# Patient Record
Sex: Male | Born: 1975 | Hispanic: Yes | Marital: Married | Smoking: Never smoker
Health system: Southern US, Community
[De-identification: ages and names within clinical notes are randomized; demographics above are authoritative.]

## PROBLEM LIST (undated history)

## (undated) DIAGNOSIS — I10 Essential (primary) hypertension: Secondary | ICD-10-CM

## (undated) DIAGNOSIS — K219 Gastro-esophageal reflux disease without esophagitis: Secondary | ICD-10-CM

## (undated) DIAGNOSIS — R519 Headache, unspecified: Secondary | ICD-10-CM

## (undated) DIAGNOSIS — R001 Bradycardia, unspecified: Secondary | ICD-10-CM

## (undated) DIAGNOSIS — R51 Headache: Secondary | ICD-10-CM

## (undated) DIAGNOSIS — K760 Fatty (change of) liver, not elsewhere classified: Secondary | ICD-10-CM

## (undated) HISTORY — PX: APPENDECTOMY: SHX54

---

## 2016-11-20 DIAGNOSIS — J019 Acute sinusitis, unspecified: Secondary | ICD-10-CM | POA: Diagnosis not present

## 2016-11-29 ENCOUNTER — Emergency Department (HOSPITAL_COMMUNITY): Payer: BLUE CROSS/BLUE SHIELD

## 2016-11-29 ENCOUNTER — Emergency Department (HOSPITAL_COMMUNITY)
Admission: EM | Admit: 2016-11-29 | Discharge: 2016-11-29 | Disposition: A | Discharge status: Home / Self Care | Payer: BLUE CROSS/BLUE SHIELD | Attending: Emergency Medicine | Admitting: Emergency Medicine

## 2016-11-29 ENCOUNTER — Encounter (HOSPITAL_COMMUNITY): Payer: Self-pay | Admitting: Emergency Medicine

## 2016-11-29 DIAGNOSIS — Z7982 Long term (current) use of aspirin: Secondary | ICD-10-CM | POA: Insufficient documentation

## 2016-11-29 DIAGNOSIS — R072 Precordial pain: Secondary | ICD-10-CM | POA: Diagnosis not present

## 2016-11-29 DIAGNOSIS — R202 Paresthesia of skin: Secondary | ICD-10-CM | POA: Insufficient documentation

## 2016-11-29 DIAGNOSIS — R0789 Other chest pain: Secondary | ICD-10-CM | POA: Diagnosis present

## 2016-11-29 DIAGNOSIS — R079 Chest pain, unspecified: Secondary | ICD-10-CM | POA: Diagnosis not present

## 2016-11-29 LAB — CBC
HEMATOCRIT: 46.7 % (ref 39.0–52.0)
HEMOGLOBIN: 15.5 g/dL (ref 13.0–17.0)
MCH: 30.2 pg (ref 26.0–34.0)
MCHC: 33.2 g/dL (ref 30.0–36.0)
MCV: 90.9 fL (ref 78.0–100.0)
Platelets: 234 10*3/uL (ref 150–400)
RBC: 5.14 MIL/uL (ref 4.22–5.81)
RDW: 13.1 % (ref 11.5–15.5)
WBC: 8 10*3/uL (ref 4.0–10.5)

## 2016-11-29 LAB — HEPATIC FUNCTION PANEL
ALT: 35 U/L (ref 17–63)
AST: 22 U/L (ref 15–41)
Albumin: 4.2 g/dL (ref 3.5–5.0)
Alkaline Phosphatase: 52 U/L (ref 38–126)
BILIRUBIN TOTAL: 0.8 mg/dL (ref 0.3–1.2)
Total Protein: 7.7 g/dL (ref 6.5–8.1)

## 2016-11-29 LAB — BASIC METABOLIC PANEL
ANION GAP: 7 (ref 5–15)
BUN: 11 mg/dL (ref 6–20)
CHLORIDE: 104 mmol/L (ref 101–111)
CO2: 28 mmol/L (ref 22–32)
Calcium: 9.5 mg/dL (ref 8.9–10.3)
Creatinine, Ser: 0.9 mg/dL (ref 0.61–1.24)
GFR calc Af Amer: >60 mL/min (ref >60)
GLUCOSE: 100 mg/dL — AB (ref 65–99)
POTASSIUM: 3.7 mmol/L (ref 3.5–5.1)
Sodium: 139 mmol/L (ref 135–145)

## 2016-11-29 LAB — LIPASE, BLOOD: Lipase: 27 U/L (ref 11–51)

## 2016-11-29 LAB — I-STAT TROPONIN, ED: Troponin i, poc: 0 ng/mL (ref 0.00–0.08)

## 2016-11-29 MED ORDER — PANTOPRAZOLE SODIUM 20 MG PO TBEC: 20.0000 mg | DELAYED_RELEASE_TABLET | Freq: Every day | ORAL | 0 refills | Status: DC

## 2016-11-29 MED ORDER — PANTOPRAZOLE SODIUM 20 MG PO TBEC
20.0000 mg | DELAYED_RELEASE_TABLET | Freq: Once | ORAL | Status: AC
  Administered 2016-11-29: 20 mg via ORAL
  Filled 2016-11-29: qty 1

## 2016-11-29 MED ORDER — ASPIRIN 81 MG PO CHEW: 81.0000 mg | CHEWABLE_TABLET | Freq: Every day | ORAL | 0 refills | Status: DC

## 2016-11-29 MED ORDER — ASPIRIN 81 MG PO CHEW
81.0000 mg | CHEWABLE_TABLET | Freq: Once | ORAL | Status: AC
  Administered 2016-11-29: 81 mg via ORAL
  Filled 2016-11-29: qty 1

## 2016-11-29 NOTE — ED Provider Notes (Signed)
[facility]-EMER[facility]ENCY DEPT Provider Note   CSN: [id number] Arrival date & time: 11/29/16  1657     History   Chief Complaint Chief Complaint  Patient presents with  . Chest Pain  . Extremity Weakness  . Abdominal Pain    HPI Wayne Stewart is a 41 y.o. male.  HPI Patient has no past medical history. He reports that about a week ago he got a central chest discomfort that he also perceived into his back. He reports it lasted for several hours and then went away. He described it to be like a deeper bruise or aching kind of feeling. It occurred at rest. He did not have other associated symptoms at that time. He went through most of the weekend without any symptoms. He reports yesterday he had an episode where he felt like his left arm and left leg had a slightly tingly sensation. He denies there was any weakness or dysfunction. There was no limitation to his activities. He does not endorse numbness. At that time he doesn't recall there was any of the central chest and discomfort associated. He also notes he said some discomfort as migrated a bit in his back from his central back to his left back/shoulder area. 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 He has not noted any pain or physical limitation or dyspnea on exertion with activity such as playing with his children or things require more physical exertion.  Social history: Patient is a nonsmoker. Family history: Patient's mother has a pacemaker. Patient's father had a heart attack later in life but is living. Patient has healthy sister. History reviewed. No pertinent past medical history.  There are no active problems to display for this patient.   History reviewed. No pertinent surgical history.     Home Medications    Prior to Admission medications   Medication Sig Start Date End Date Taking? Authorizing Provider  aspirin 81 MG chewable tablet Chew 1 tablet (81 mg total) by mouth  daily. 11/29/16   Arby Barrette, MD  pantoprazole (PROTONIX) 20 MG tablet Take 1 tablet (20 mg total) by mouth daily. 11/29/16   Arby Barrette, MD    Family History No family history on file.  Social History Social History  Substance Use Topics  . Smoking status: Never Smoker  . Smokeless tobacco: Never Used  . Alcohol use Yes     Allergies   Patient has no allergy information on record.   Review of Systems Review of Systems 10 Systems reviewed and are negative for acute change except as noted in the HPI.   Physical Exam Updated Vital Signs BP 138/88   Pulse (!) 53   Temp 97.9 F (36.6 C) (Oral)   Resp 12   Ht 5\' 6"  (1.676 m)   Wt 205 lb (93 kg)   SpO2 100%   BMI 33.09 kg/m   Physical Exam  Constitutional: He is oriented to person, place, and time. He appears well-developed and well-nourished.  HENT:  Head: Normocephalic and atraumatic.  Eyes: Conjunctivae and EOM are normal.  Neck: Neck supple.  Cardiovascular: Normal rate and regular rhythm.   No murmur heard. Pulmonary/Chest: Effort normal and breath sounds normal. No respiratory distress.  Abdominal: Soft. He exhibits no distension. There is no tenderness. There is no guarding.  Musculoskeletal: He exhibits no edema or tenderness.  Neurological: He is alert and oriented to person, place, and time. No cranial nerve deficit or sensory  deficit. He exhibits normal muscle tone. Coordination normal.  Skin: Skin is warm and dry.  Psychiatric: He has a normal mood and affect.  Nursing note and vitals reviewed.    ED Treatments / Results  Labs (all labs ordered are listed, but only abnormal results are displayed) Labs Reviewed  BASIC METABOLIC PANEL - Abnormal; Notable for the following:       Result Value   Glucose, Bld 100 (*)    All other components within normal limits  HEPATIC FUNCTION PANEL - Abnormal; Notable for the following:    Bilirubin, Direct <0.1 (*)    All other components within normal limits   CBC  LIPASE, BLOOD  I-STAT TROPOININ, ED    EKG  EKG Interpretation  Date/Time:  Wednesday November 29 2016 17:01:20 EST Ventricular Rate:  53 PR Interval:  168 QRS Duration: 100 QT Interval:  412 QTC Calculation: 386 R Axis:   27 Text Interpretation:  Sinus bradycardia Otherwise normal ECG no STEMI. no acute ischemic appearance. no old comparison Confirmed by Donnald GarrePfeiffer, MD, Lebron ConnersMarcy [phone]([id number]) on 11/29/2016 6:32:03 PM       Radiology Dg Chest 2 View  Result Date: 11/29/2016 CLINICAL DATA:  Upper chest pain and nausea for 1 week. Initial encounter. EXAM: CHEST  2 VIEW COMPARISON:  None. FINDINGS: The heart size is exaggerated by low lung volumes. Mild bibasilar atelectasis is present. There is no significant edema or effusion. No focal airspace consolidation is present. The visualized soft tissues and bony thorax are unremarkable. IMPRESSION: 1. Low lung volumes. 2. No acute cardiopulmonary disease. Electronically Signed   By: Marin Robertshristopher  Mattern M.D.   On: 11/29/2016 17:25    Procedures Procedures (including critical care time)  Medications Ordered in ED Medications  pantoprazole (PROTONIX) EC tablet 20 mg (20 mg Oral Given 11/29/16 1946)  aspirin chewable tablet 81 mg (81 mg Oral Given 11/29/16 1946)     Initial Impression / Assessment and Plan / ED Course  I have reviewed the triage vital signs and the nursing notes.  Pertinent labs & imaging results that were available during my care of the patient were reviewed by me and considered in my medical decision making (see chart for details).     Final Clinical Impressions(s) / ED Diagnoses   Final diagnoses:  Substernal precordial chest pain  Paresthesia of left upper and lower extremity  Patient had a episode chest pain several days ago and then a recurrence today. These are occurring at rest. There is a somewhat migratory component. Patient  also experienced a limited episode of paresthesia but not specifically associated with  chest pain. This time, is no evidence of acute MI or unstable angina. Patient's heart score is 1-2. The patient does not have diagnosed hypertension although diastolic pressures have been slightly elevated while in the emergency department (80s-90s). He is counseled on the necessity for monitoring this and charge instructions include initial dietary and lifestyle management for hypertension.  New Prescriptions Discharge Medication List as of 11/29/2016  9:43 PM    START taking these medications   Details  aspirin 81 MG chewable tablet Chew 1 tablet (81 mg total) by mouth daily., Starting Wed 11/29/2016, Print    pantoprazole (PROTONIX) 20 MG tablet Take 1 tablet (20 mg total) by mouth daily., Starting Wed 11/29/2016, Print         Arby BarretteMarcy Camala Talwar, MD 11/29/16 2156

## 2016-11-29 NOTE — ED Triage Notes (Signed)
Pt. Stated, I started having some upper chest pain to the left and went to right. Some numbness, tingling in my left arm and leg.  I've also had an upset stomach.

## 2016-11-30 ENCOUNTER — Encounter: Payer: Self-pay | Admitting: Internal Medicine

## 2016-12-01 ENCOUNTER — Encounter: Payer: Self-pay | Admitting: Internal Medicine

## 2016-12-01 ENCOUNTER — Ambulatory Visit (INDEPENDENT_AMBULATORY_CARE_PROVIDER_SITE_OTHER): Payer: BLUE CROSS/BLUE SHIELD | Admitting: Internal Medicine

## 2016-12-01 VITALS — BP 136/86 | HR 56 | Ht 66.0 in | Wt 206.8 lb

## 2016-12-01 DIAGNOSIS — R079 Chest pain, unspecified: Secondary | ICD-10-CM | POA: Diagnosis not present

## 2016-12-01 NOTE — Patient Instructions (Addendum)
Medication Instructions:    Your physician recommends that you continue on your current medications as directed. Please refer to the Current Medication list given to you today.  --- If you need a refill on your cardiac medications before your next appointment, please call your pharmacy. ---  Labwork:  Lipid profile today  Testing/Procedures:  Your physician recommends that you have a calcium score CT.  Follow-Up:  To be determined after testing.  We will call you with the results   Thank you for choosing [facility]!!     Any Other Special Instructions Will Be Listed Below (If Applicable).

## 2016-12-01 NOTE — Progress Notes (Signed)
Cardiology Office Note   Date:  12/01/2016   ID:  Zaryan Yakubov, DOB [birth year], MRN [id number]  PCP:  No primary care provider on file.  Cardiologist:   Dietrich Pates, MD   Patient referred for CP      History of Present Illness: Wayne Stewart is a 41 y.o. male with a history of  chest discomfort  He was seen in ER on 3/7  Pain also in back Tingly sensation in L arm and L leg    Sensations with and without activity   Will feel SOB with talking, not with walking   Since left the hosp still having symptoms    Moved from [place] about 8 months ago      One episode of dizziness  Recently     Past 3 to 4 wks get L arm and leg tingling  Will last  a couple min then goes away  Can be walking and will occur Current Meds  Medication Sig  . aspirin 81 MG chewable tablet Chew 1 tablet (81 mg total) by mouth daily.  . pantoprazole (PROTONIX) 20 MG tablet Take 1 tablet (20 mg total) by mouth daily.     Allergies:   Patient has no known allergies.   History reviewed. No pertinent past medical history. no medical problems   APpendix removed age 56    History reviewed. No pertinent surgical history.   Social History:  The patient  reports that he has never smoked. He has never used smokeless tobacco. He reports that he drinks alcohol. He reports that he does not use drugs.   6 to 8 beers over weekend    Family History:  The patient's family history is not on file.   Dad died of heart attack first event Dad was a Government social research officer   Mother with irreg HB  Has pacemaker   Maternal GF  Strokes    Grandmother    ROS:  Please see the history of present illness. All other systems are reviewed and  Negative to the above problem except as noted.    PHYSICAL EXAM: VS:  BP 136/86   Pulse (!) 56   Ht 5\' 6"  (1.676 m)   Wt 206 lb 12.8 oz (93.8 kg)   BMI 33.38 kg/m   GEN: Well nourished, well developed, in no acute distress  HEENT: normal  Neck: no JVD, carotid bruits, or masses Cardiac: RRR;  no murmurs, rubs, or gallops,no edema  Respiratory:  clear to auscultation bilaterally, normal work of breathing GI: soft, nontender, nondistended, + BS  No hepatomegaly  MS: no deformity Moving all extremities   Skin: warm and dry, no rash Neuro:  Strength and sensation are intact Psych: euthymic mood, full affect   EKG:  EKG is ordered today. SB 53 bpm     Lipid Panel No results found for: CHOL, TRIG, HDL, CHOLHDL, VLDL, LDLCALC, LDLDIRECT    Wt Readings from Last 3 Encounters:  12/01/16 206 lb 12.8 oz (93.8 kg)  11/29/16 205 lb (93 kg)      ASSESSMENT AND PLAN:  1  Chest pain  / Back pain  Atypical  Concerning  Pt with family history though father was a smoker  I reviewed with patient  I would reocmm 1. Calcium score CT and 2. Lipid panel  Continue current regimen  2 L sided tingling  Not sure what this represents. Follow for now.  I am not convinced any central neuro process  Current medicines are reviewed at length with the patient today.  The patient does not have concerns regarding medicines.  Signed, Dietrich PatesPaula Artavis Cowie, MD  12/01/2016 4:06 PM    [facility] [address], [place], [place]  [address] Phone: [phone]) [phone]; Fax: [phone]) [phone]

## 2016-12-02 LAB — LIPID PANEL
CHOLESTEROL TOTAL: 232 mg/dL — AB (ref 100–199)
Chol/HDL Ratio: 5.3 ratio units — ABNORMAL HIGH (ref 0.0–5.0)
HDL: 44 mg/dL (ref >39)
LDL CALC: 137 mg/dL — AB (ref 0–99)
TRIGLYCERIDES: 253 mg/dL — AB (ref 0–149)
VLDL CHOLESTEROL CAL: 51 mg/dL — AB (ref 5–40)

## 2016-12-14 ENCOUNTER — Ambulatory Visit (INDEPENDENT_AMBULATORY_CARE_PROVIDER_SITE_OTHER)
Admission: RE | Admit: 2016-12-14 | Discharge: 2016-12-14 | Disposition: A | Discharge status: Home / Self Care | Payer: Self-pay | Source: Ambulatory Visit | Attending: Internal Medicine | Admitting: Internal Medicine

## 2016-12-14 DIAGNOSIS — R079 Chest pain, unspecified: Secondary | ICD-10-CM

## 2016-12-21 ENCOUNTER — Encounter: Payer: Self-pay | Admitting: *Deleted

## 2017-01-08 DIAGNOSIS — E782 Mixed hyperlipidemia: Secondary | ICD-10-CM | POA: Diagnosis not present

## 2017-01-08 DIAGNOSIS — K219 Gastro-esophageal reflux disease without esophagitis: Secondary | ICD-10-CM | POA: Diagnosis not present

## 2017-01-08 DIAGNOSIS — J309 Allergic rhinitis, unspecified: Secondary | ICD-10-CM | POA: Diagnosis not present

## 2017-01-08 DIAGNOSIS — Z6834 Body mass index (BMI) 34.0-34.9, adult: Secondary | ICD-10-CM | POA: Diagnosis not present

## 2017-08-10 DIAGNOSIS — J069 Acute upper respiratory infection, unspecified: Secondary | ICD-10-CM | POA: Diagnosis not present

## 2017-08-10 DIAGNOSIS — Z6831 Body mass index (BMI) 31.0-31.9, adult: Secondary | ICD-10-CM | POA: Diagnosis not present

## 2017-08-27 DIAGNOSIS — Z6831 Body mass index (BMI) 31.0-31.9, adult: Secondary | ICD-10-CM | POA: Diagnosis not present

## 2017-08-27 DIAGNOSIS — B029 Zoster without complications: Secondary | ICD-10-CM | POA: Diagnosis not present

## 2017-09-14 DIAGNOSIS — B029 Zoster without complications: Secondary | ICD-10-CM | POA: Diagnosis not present

## 2017-09-14 DIAGNOSIS — Z6832 Body mass index (BMI) 32.0-32.9, adult: Secondary | ICD-10-CM | POA: Diagnosis not present

## 2017-09-14 DIAGNOSIS — E782 Mixed hyperlipidemia: Secondary | ICD-10-CM | POA: Diagnosis not present

## 2017-09-14 DIAGNOSIS — R030 Elevated blood-pressure reading, without diagnosis of hypertension: Secondary | ICD-10-CM | POA: Diagnosis not present

## 2017-11-13 DIAGNOSIS — Z6832 Body mass index (BMI) 32.0-32.9, adult: Secondary | ICD-10-CM | POA: Diagnosis not present

## 2017-11-13 DIAGNOSIS — J069 Acute upper respiratory infection, unspecified: Secondary | ICD-10-CM | POA: Diagnosis not present

## 2017-11-14 DIAGNOSIS — J101 Influenza due to other identified influenza virus with other respiratory manifestations: Secondary | ICD-10-CM | POA: Diagnosis not present

## 2017-11-14 DIAGNOSIS — Z6832 Body mass index (BMI) 32.0-32.9, adult: Secondary | ICD-10-CM | POA: Diagnosis not present

## 2017-11-14 DIAGNOSIS — J069 Acute upper respiratory infection, unspecified: Secondary | ICD-10-CM | POA: Diagnosis not present

## 2017-12-12 IMAGING — CT CT HEART SCORING
2 series · 16 of 20 positions shown, 18 images · non-contrast
Comparison: None.

CLINICAL DATA: Risk stratification

EXAM:
Coronary Calcium Score
TECHNIQUE: The patient was scanned on a Siemens Somatom 64 slice scanner. Axial
non-contrast 3 mm slices were carried out through the heart. The
data set was analyzed on a dedicated work station and scored using
the Agatson method.

[Series 3: lung st 70 % · axial · 0.67mm/px · z∈[-314,-230]mm · 8 of 36 slices shown]
[im 4/36  lung]
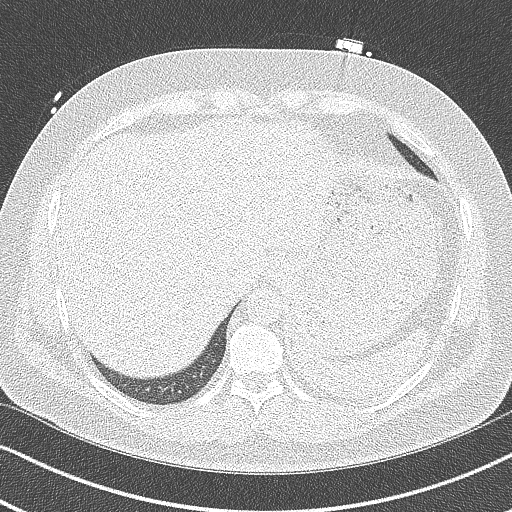
[im 8/36  lung]
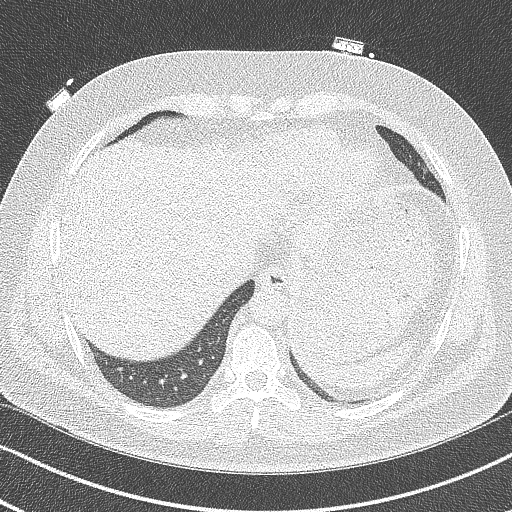
[im 12/36  lung]
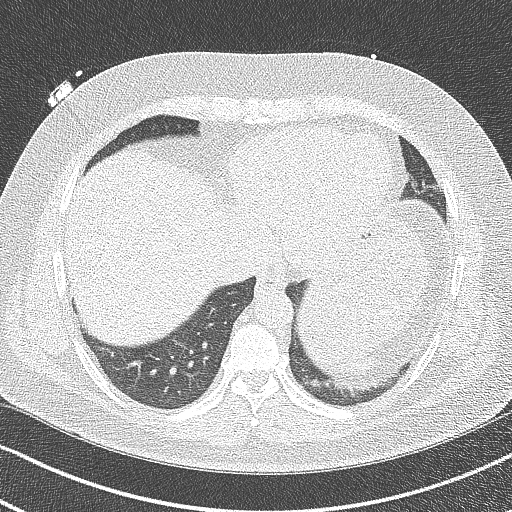
[im 16/36  lung]
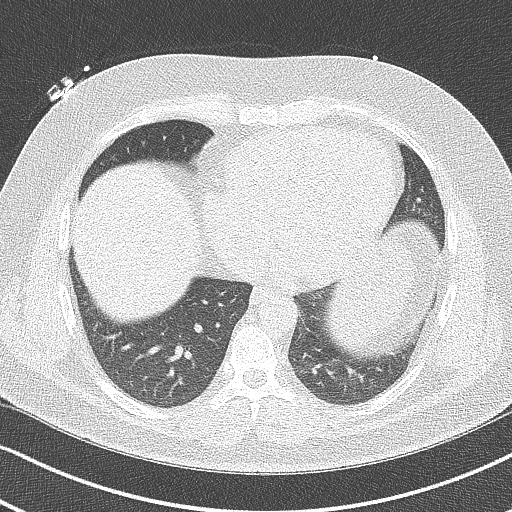
[im 20/36  lung]
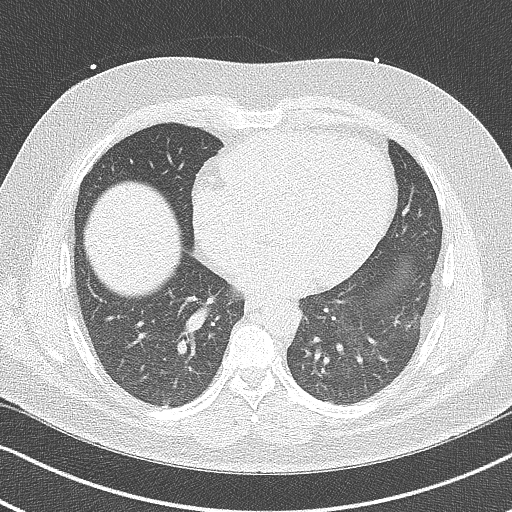
[im 24/36  lung]
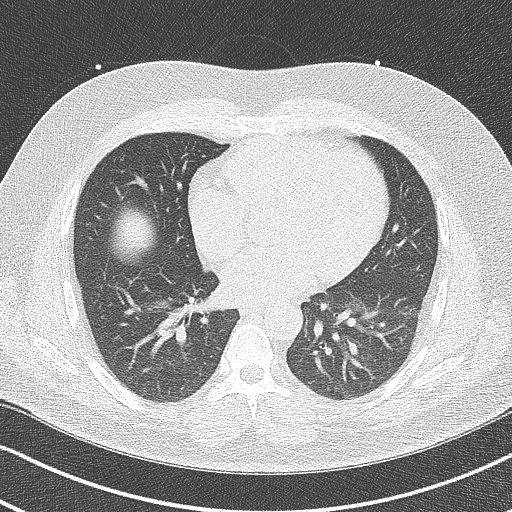
[im 28/36  lung]
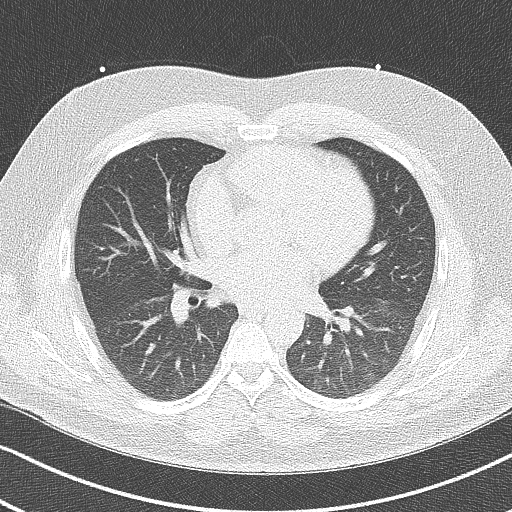
[im 32/36  lung]
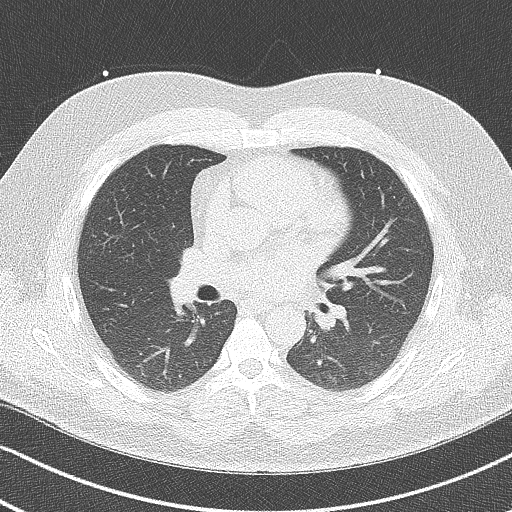

[Series 7: casc 3.0 i36f 2 bestdiast...2 70 % · axial · 0.36mm/px · z∈[-314,-230]mm · 8 of 37 slices shown, 10 images]
[im 5/37  vessel]
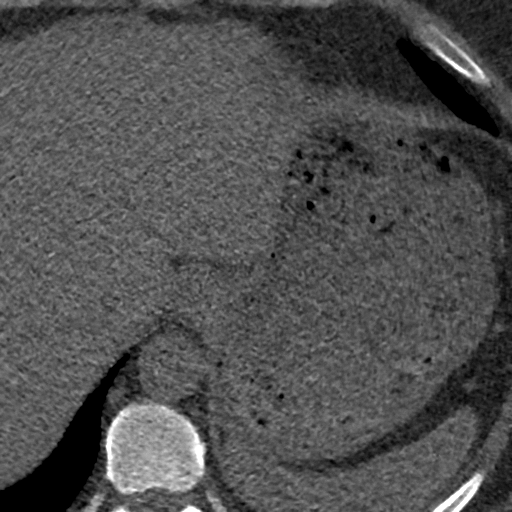
[im 5/37  lung]
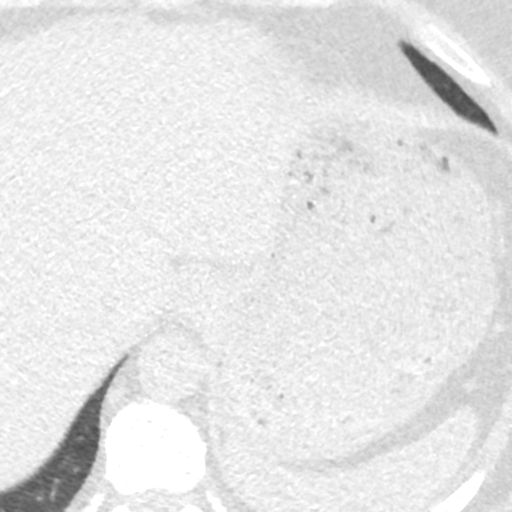
[im 9/37  vessel]
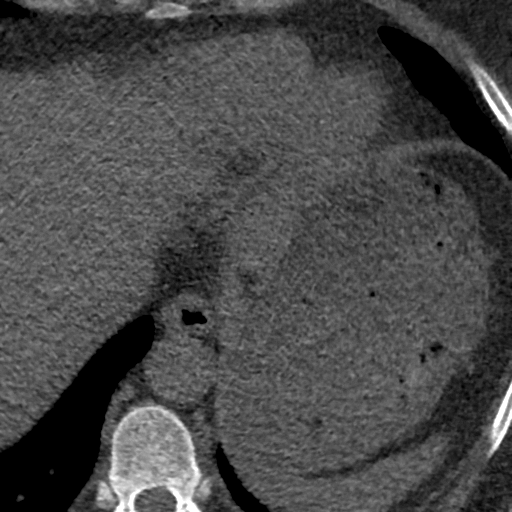
[im 13/37  vessel]
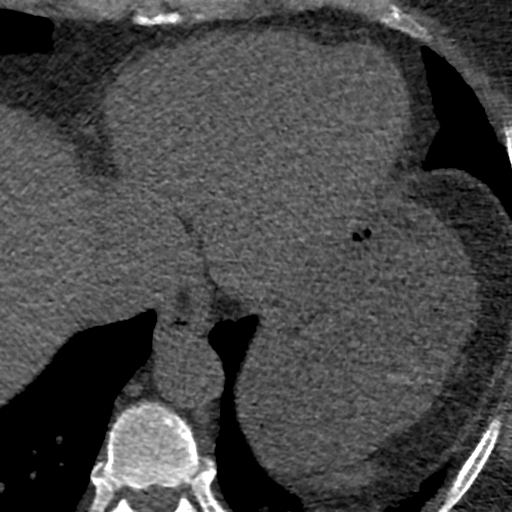
[im 17/37  vessel]
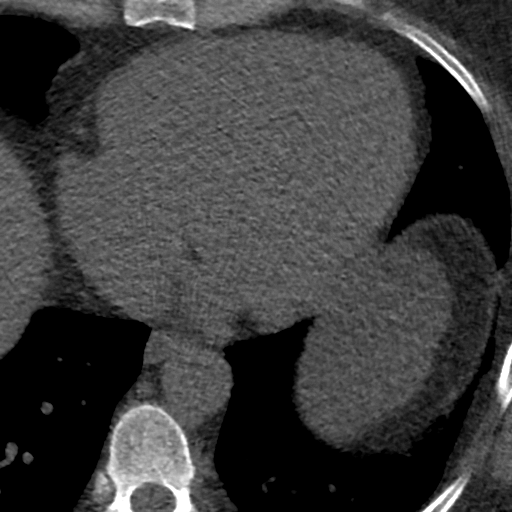
[im 21/37  vessel]
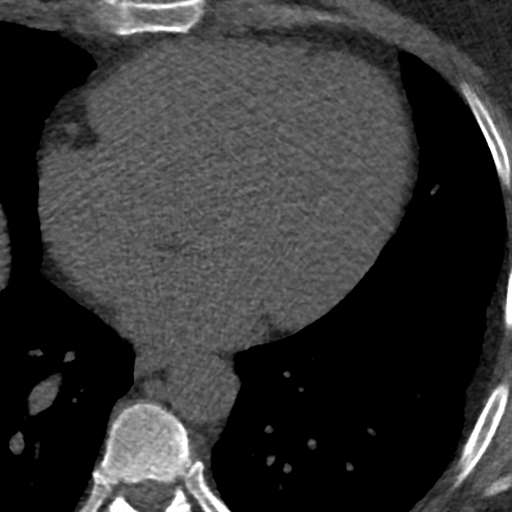
[im 21/37  lung]
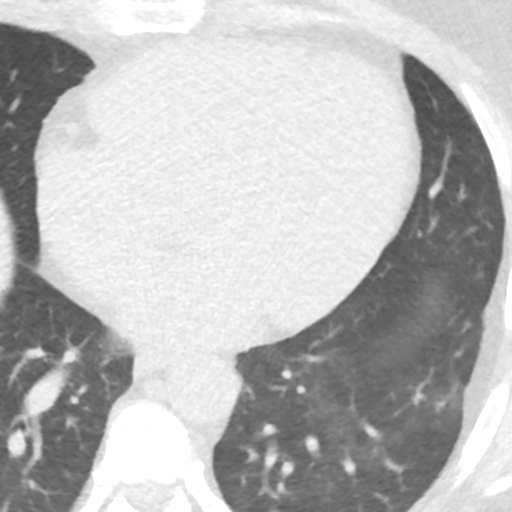
[im 25/37  vessel]
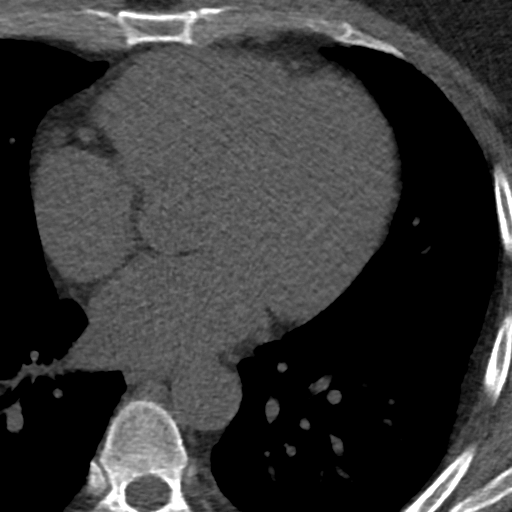
[im 29/37  vessel]
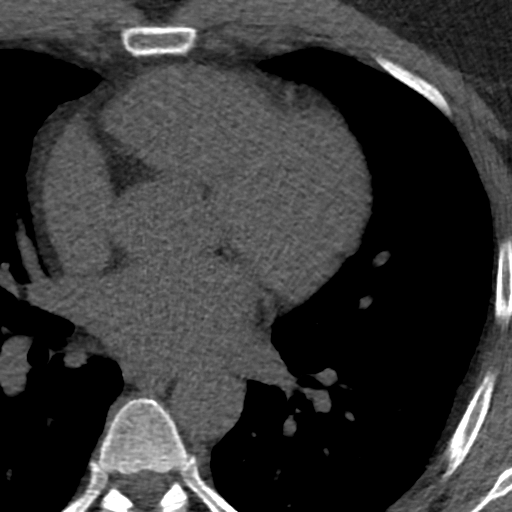
[im 33/37  vessel]
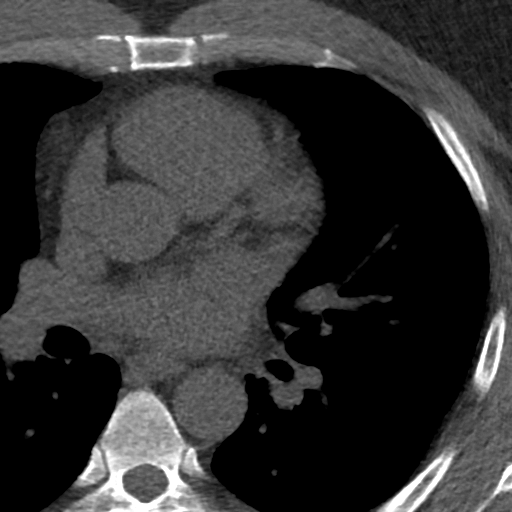

[16 of 20 positions shown; findings below may reference images not displayed]

FINDINGS: Non-cardiac: See separate report from [REDACTED].

Ascending Aorta:  Normal size.  No calcifications.

Pericardium: Normal.

Coronary arteries:  Normal origin.
IMPRESSION: Coronary calcium score of 0. This was 0 percentile for age and sex
matched control.

Asaanah Kusi

EXAM:
OVER-READ INTERPRETATION  CT CHEST

The following report is an over-read performed by radiologist Dr.
over-read does not include interpretation of cardiac or coronary
anatomy or pathology. The coronary calcium scoretation by the
cardiologist is attached.
FINDINGS: Within the visualized portions of the thorax there are no suspicious
appearing pulmonary nodules or masses, there is no acute
consolidative airspace disease, no pleural effusions, no
pneumothorax and no lymphadenopathy. Visualized portions of the
upper abdomen are unremarkable. There are no aggressive appearing
lytic or blastic lesions noted in the visualized portions of the
skeleton.
IMPRESSION: No significant incidental noncardiac findings are noted.

## 2018-01-03 DIAGNOSIS — Z6832 Body mass index (BMI) 32.0-32.9, adult: Secondary | ICD-10-CM | POA: Diagnosis not present

## 2018-01-03 DIAGNOSIS — Z23 Encounter for immunization: Secondary | ICD-10-CM | POA: Diagnosis not present

## 2018-01-03 DIAGNOSIS — J309 Allergic rhinitis, unspecified: Secondary | ICD-10-CM | POA: Diagnosis not present

## 2018-01-03 DIAGNOSIS — E782 Mixed hyperlipidemia: Secondary | ICD-10-CM | POA: Diagnosis not present

## 2018-01-03 DIAGNOSIS — R030 Elevated blood-pressure reading, without diagnosis of hypertension: Secondary | ICD-10-CM | POA: Diagnosis not present

## 2018-02-14 ENCOUNTER — Other Ambulatory Visit: Payer: Self-pay | Admitting: Family Medicine

## 2018-02-14 DIAGNOSIS — Z23 Encounter for immunization: Secondary | ICD-10-CM | POA: Diagnosis not present

## 2018-02-14 DIAGNOSIS — E782 Mixed hyperlipidemia: Secondary | ICD-10-CM | POA: Diagnosis not present

## 2018-02-14 DIAGNOSIS — R1013 Epigastric pain: Secondary | ICD-10-CM

## 2018-02-14 DIAGNOSIS — K219 Gastro-esophageal reflux disease without esophagitis: Secondary | ICD-10-CM | POA: Diagnosis not present

## 2018-02-14 DIAGNOSIS — Z6832 Body mass index (BMI) 32.0-32.9, adult: Secondary | ICD-10-CM | POA: Diagnosis not present

## 2018-02-14 DIAGNOSIS — I10 Essential (primary) hypertension: Secondary | ICD-10-CM | POA: Diagnosis not present

## 2018-02-25 ENCOUNTER — Ambulatory Visit
Admission: RE | Admit: 2018-02-25 | Discharge: 2018-02-25 | Disposition: A | Discharge status: Home / Self Care | Payer: BLUE CROSS/BLUE SHIELD | Source: Ambulatory Visit | Attending: Family Medicine | Admitting: Family Medicine

## 2018-02-25 DIAGNOSIS — K760 Fatty (change of) liver, not elsewhere classified: Secondary | ICD-10-CM | POA: Diagnosis not present

## 2018-02-25 DIAGNOSIS — R1013 Epigastric pain: Secondary | ICD-10-CM

## 2018-02-25 DIAGNOSIS — K8020 Calculus of gallbladder without cholecystitis without obstruction: Secondary | ICD-10-CM | POA: Diagnosis not present

## 2018-03-08 DIAGNOSIS — R5383 Other fatigue: Secondary | ICD-10-CM | POA: Diagnosis not present

## 2018-03-08 DIAGNOSIS — I10 Essential (primary) hypertension: Secondary | ICD-10-CM | POA: Diagnosis not present

## 2018-03-11 DIAGNOSIS — K219 Gastro-esophageal reflux disease without esophagitis: Secondary | ICD-10-CM | POA: Diagnosis not present

## 2018-03-11 DIAGNOSIS — E559 Vitamin D deficiency, unspecified: Secondary | ICD-10-CM | POA: Diagnosis not present

## 2018-03-11 DIAGNOSIS — I10 Essential (primary) hypertension: Secondary | ICD-10-CM | POA: Diagnosis not present

## 2018-03-11 DIAGNOSIS — Z6833 Body mass index (BMI) 33.0-33.9, adult: Secondary | ICD-10-CM | POA: Diagnosis not present

## 2018-03-14 DIAGNOSIS — K8020 Calculus of gallbladder without cholecystitis without obstruction: Secondary | ICD-10-CM | POA: Diagnosis not present

## 2018-03-18 ENCOUNTER — Ambulatory Visit: Payer: Self-pay | Admitting: General Surgery

## 2018-03-18 NOTE — H&P (View-Only) (Signed)
Wayne Stewart Documented: 03/14/2018 4:00 PM Location: [facility] Patient #: [id number] DOB: 07/31/1976 Married / Language: English / Race: Undefined Male   History of Present Illness (Avelynn Sellin M. Marveen Donlon MD; 03/14/2018 4:32 PM) The patient is a 41 year old male who presents for evaluation of gall stones. He is referred by Dr Dewey for evaluation of gallstones and abdominal pain. He states about a month or so ago he developed upper abdominal pain and right upper quadrant pain as well as heartburn and indigestion. He describes it is a fairly constant discomfort but it would increase at times. When he would get it it would last up to a few days but the temporary increase in pain would only last a few hours. It could occur after eating it was often associated with nausea. He had no fever, chills, vomiting. Only prior surgery was appendectomy. No change in bowel movements. He saw his primary care physician and was placed on a PPI as well as an abdominal ultrasound and labs. His labs were unremarkable. His ultrasound showed cholelithiasis without cholecystitis. And fatty infiltration of his liver. He was started on a PPI for study help with some of his issues but he still having right upper quadrant pain rating to this side and upper back.   Problem List/Past Medical (Glyndon Tursi M. Delvina Mizzell, MD; 03/14/2018 4:32 PM) SYMPTOMATIC CHOLELITHIASIS (K80.20)   Past Surgical History (Tanisha A. Brown, RMA; 03/14/2018 4:01 PM) Appendectomy   Diagnostic Studies History (Tanisha A. Brown, RMA; 03/14/2018 4:01 PM) Colonoscopy  never  Allergies (Tanisha A. Brown, RMA; 03/14/2018 4:01 PM) No Known Drug Allergies [03/14/2018]: Allergies Reconciled   Medication History (Tanisha A. Brown, RMA; 03/14/2018 4:02 PM) Atorvastatin Calcium (20MG Tablet, Oral) Active. Losartan Potassium (50MG Tablet, Oral) Active. Omeprazole (40MG Capsule DR, Oral) Active. Aspirin (81MG Tablet, Oral) Active. Medications  Reconciled  Social History (Tanisha A. Brown, RMA; 03/14/2018 4:01 PM) Alcohol use  Moderate alcohol use. Caffeine use  Carbonated beverages, Coffee, Tea. No drug use  Tobacco use  Never smoker.  Family History (Tanisha A. Brown, RMA; 03/14/2018 4:01 PM) Heart Disease  Father.  Other Problems (Ramondo Dietze M. Javares Kaufhold, MD; 03/14/2018 4:32 PM) Back Pain  Chest pain  Cholelithiasis  Gastroesophageal Reflux Disease  High blood pressure  Hypercholesterolemia     Review of Systems (Tanisha A. Brown RMA; 03/14/2018 4:01 PM) General Present- Fatigue. Not Present- Appetite Loss, Chills, Fever, Night Sweats, Weight Gain and Weight Loss. Skin Not Present- Change in Wart/Mole, Dryness, Hives, Jaundice, New Lesions, Non-Healing Wounds, Rash and Ulcer. HEENT Present- Earache, Seasonal Allergies and Sore Throat. Not Present- Hearing Loss, Hoarseness, Nose Bleed, Oral Ulcers, Ringing in the Ears, Sinus Pain, Visual Disturbances, Wears glasses/contact lenses and Yellow Eyes. Respiratory Present- Snoring. Not Present- Bloody sputum, Chronic Cough, Difficulty Breathing and Wheezing. Breast Not Present- Breast Mass, Breast Pain, Nipple Discharge and Skin Changes. Cardiovascular Present- Chest Pain. Not Present- Difficulty Breathing Lying Down, Leg Cramps, Palpitations, Rapid Heart Rate, Shortness of Breath and Swelling of Extremities. Gastrointestinal Present- Abdominal Pain and Indigestion. Not Present- Bloating, Bloody Stool, Change in Bowel Habits, Chronic diarrhea, Constipation, Difficulty Swallowing, Excessive gas, Gets full quickly at meals, Hemorrhoids, Nausea, Rectal Pain and Vomiting. Male Genitourinary Not Present- Blood in Urine, Change in Urinary Stream, Frequency, Impotence, Nocturia, Painful Urination, Urgency and Urine Leakage. Musculoskeletal Present- Back Pain. Not Present- Joint Pain, Joint Stiffness, Muscle Pain, Muscle Weakness and Swelling of Extremities. Neurological Not Present-  Decreased Memory, Fainting, Headaches, Numbness, Seizures, Tingling, Tremor, Trouble walking and Weakness.   Psychiatric Not Present- Anxiety, Bipolar, Change in Sleep Pattern, Depression, Fearful and Frequent crying. Endocrine Not Present- Cold Intolerance, Excessive Hunger, Hair Changes, Heat Intolerance, Hot flashes and New Diabetes. Hematology Not Present- Blood Thinners, Easy Bruising, Excessive bleeding, Gland problems, HIV and Persistent Infections.  Vitals (Tanisha A. Brown RMA; 03/14/2018 4:01 PM) 03/14/2018 4:01 PM Weight: 209.9 lb Height: 66in Body Surface Area: 2.04 m Body Mass Index: 33.88 kg/m  Temp.: 97.8F  Pulse: 88 (Regular)  BP: 124/88 (Sitting, Left Arm, Standard)       Physical Exam (Duilio Heritage M. Alejandra Barna MD; 03/14/2018 4:29 PM) General Mental Status-Alert. General Appearance-Consistent with stated age. Hydration-Well hydrated. Voice-Normal.  Head and Neck Head-normocephalic, atraumatic with no lesions or palpable masses. Trachea-midline. Thyroid Gland Characteristics - normal size and consistency.  Eye Eyeball - Bilateral-Extraocular movements intact. Sclera/Conjunctiva - Bilateral-No scleral icterus.  ENMT Mouth and Throat -Note: lips intact.  Note: normal ext ears   Chest and Lung Exam Chest and lung exam reveals -quiet, even and easy respiratory effort with no use of accessory muscles and on auscultation, normal breath sounds, no adventitious sounds and normal vocal resonance. Inspection Chest Wall - Normal. Back - normal.  Breast - Did not examine.  Cardiovascular Cardiovascular examination reveals -normal heart sounds, regular rate and rhythm with no murmurs and normal pedal pulses bilaterally.  Abdomen Inspection Inspection of the abdomen reveals - No Hernias. Skin - Scar - no surgical scars. Palpation/Percussion Palpation and Percussion of the abdomen reveal - Soft, Non Tender, No Rebound tenderness, No  Rigidity (guarding) and No hepatosplenomegaly. Auscultation Auscultation of the abdomen reveals - Bowel sounds normal. Note: old appy scar   Peripheral Vascular Upper Extremity Palpation - Pulses bilaterally normal.  Neurologic Neurologic evaluation reveals -alert and oriented x 3 with no impairment of recent or remote memory. Mental Status-Normal.  Neuropsychiatric The patient's mood and affect are described as -normal. Judgment and Insight-insight is appropriate concerning matters relevant to self.  Musculoskeletal Normal Exam - Left-Upper Extremity Strength Normal and Lower Extremity Strength Normal. Normal Exam - Right-Upper Extremity Strength Normal and Lower Extremity Strength Normal.  Lymphatic Head & Neck  General Head & Neck Lymphatics: Bilateral - Description - Normal. Axillary - Did not examine. Femoral & Inguinal - Did not examine.    Assessment & Plan (Tc Kapusta M. Kaoru Benda MD; 03/14/2018 4:28 PM) SYMPTOMATIC CHOLELITHIASIS (K80.20) Impression: I believe the patient's symptoms are consistent with gallbladder disease.  We discussed gallbladder disease. The patient was given educational material. We discussed non-operative and operative management. We discussed the signs & symptoms of acute cholecystitis  I discussed laparoscopic cholecystectomy with IOC in detail. The patient was given educational material as well as diagrams detailing the procedure. We discussed the risks and benefits of a laparoscopic cholecystectomy including, but not limited to bleeding, infection, injury to surrounding structures such as the intestine or liver, bile leak, retained gallstones, need to convert to an open procedure, prolonged diarrhea, blood clots such as DVT, common bile duct injury, anesthesia risks, and possible need for additional procedures. We discussed the typical post-operative recovery course. I explained that the likelihood of improvement of their symptoms is  good.  The patient has elected to proceed with surgery. Current Plans Pt Education - Pamphlet Given - Laparoscopic Gallbladder Surgery: discussed with patient and provided information.  Eryc Bodey M. Marchell Froman, MD, FACS General, Bariatric, & Minimally Invasive Surgery [facility]  

## 2018-03-18 NOTE — H&P (Signed)
Wayne Stewart Documented: 03/14/2018 4:00 PM Location: [facility] Patient #: [id number] DOB: 05-24-1976 Married / Language: [place] / Race: Undefined Male   History of Present Illness Wayne Stewart M. Wayne Roan Stewart; 03/14/2018 4:32 PM) The patient is a 42 year old male who presents for evaluation of gall stones. He is referred by Dr Wayne Stewart for evaluation of gallstones and abdominal pain. He states about a month or so ago he developed upper abdominal pain and right upper quadrant pain as well as heartburn and indigestion. He describes it is a fairly constant discomfort but it would increase at times. When he would get it it would last up to a few days but the temporary increase in pain would only last a few hours. It could occur after eating it was often associated with nausea. He had no fever, chills, vomiting. Only prior surgery was appendectomy. No change in bowel movements. He saw his primary care physician and was placed on a PPI as well as an abdominal ultrasound and labs. His labs were unremarkable. His ultrasound showed cholelithiasis without cholecystitis. And fatty infiltration of his liver. He was started on a PPI for study help with some of his issues but he still having right upper quadrant pain rating to this side and upper back.   Problem List/Past Medical Wayne Stewart M. Wayne Stewart; 03/14/2018 4:32 PM) SYMPTOMATIC CHOLELITHIASIS (K80.20)   Past Surgical History (Wayne Stewart, RMA; 03/14/2018 4:01 PM) Appendectomy   Diagnostic Studies History (Wayne Stewart, RMA; 03/14/2018 4:01 PM) Colonoscopy  never  Allergies (Wayne Stewart, RMA; 03/14/2018 4:01 PM) No Known Drug Allergies [03/14/2018]: Allergies Reconciled   Medication History (Wayne Stewart, RMA; 03/14/2018 4:02 PM) Atorvastatin Calcium (20MG  Tablet, Oral) Active. Losartan Potassium (50MG  Tablet, Oral) Active. Omeprazole (40MG  Capsule DR, Oral) Active. Aspirin (81MG  Tablet, Oral) Active. Medications  Reconciled  Social History (Wayne Stewart, RMA; 03/14/2018 4:01 PM) Alcohol use  Moderate alcohol use. Caffeine use  Carbonated beverages, Coffee, Tea. No drug use  Tobacco use  Never smoker.  Family History (Wayne Stewart, RMA; 03/14/2018 4:01 PM) Heart Disease  Father.  Other Problems Wayne Stewart M. Wayne Stewart; 03/14/2018 4:32 PM) Back Pain  Chest pain  Cholelithiasis  Gastroesophageal Reflux Disease  High blood pressure  Hypercholesterolemia     Review of Systems (Wayne Stewart RMA; 03/14/2018 4:01 PM) General Present- Fatigue. Not Present- Appetite Loss, Chills, Fever, Night Sweats, Weight Gain and Weight Loss. Skin Not Present- Change in Wart/Mole, Dryness, Hives, Jaundice, New Lesions, Non-Healing Wounds, Rash and Ulcer. HEENT Present- Earache, Seasonal Allergies and Sore Throat. Not Present- Hearing Loss, Hoarseness, Nose Bleed, Oral Ulcers, Ringing in the Ears, Sinus Pain, Visual Disturbances, Wears glasses/contact lenses and Yellow Eyes. Respiratory Present- Snoring. Not Present- Bloody sputum, Chronic Cough, Difficulty Breathing and Wheezing. Breast Not Present- Breast Mass, Breast Pain, Nipple Discharge and Skin Changes. Cardiovascular Present- Chest Pain. Not Present- Difficulty Breathing Lying Down, Leg Cramps, Palpitations, Rapid Heart Rate, Shortness of Breath and Swelling of Extremities. Gastrointestinal Present- Abdominal Pain and Indigestion. Not Present- Bloating, Bloody Stool, Change in Bowel Habits, Chronic diarrhea, Constipation, Difficulty Swallowing, Excessive gas, Gets full quickly at meals, Hemorrhoids, Nausea, Rectal Pain and Vomiting. Male Genitourinary Not Present- Blood in Urine, Change in Urinary Stream, Frequency, Impotence, Nocturia, Painful Urination, Urgency and Urine Leakage. Musculoskeletal Present- Back Pain. Not Present- Joint Pain, Joint Stiffness, Muscle Pain, Muscle Weakness and Swelling of Extremities. Neurological Not Present-  Decreased Memory, Fainting, Headaches, Numbness, Seizures, Tingling, Tremor, Trouble walking and Weakness.  Psychiatric Not Present- Anxiety, Bipolar, Change in Sleep Pattern, Depression, Fearful and Frequent crying. Endocrine Not Present- Cold Intolerance, Excessive Hunger, Hair Changes, Heat Intolerance, Hot flashes and New Diabetes. Hematology Not Present- Blood Thinners, Easy Bruising, Excessive bleeding, Gland problems, HIV and Persistent Infections.  Vitals (Wayne Stewart RMA; 03/14/2018 4:01 PM) 03/14/2018 4:01 PM Weight: 209.9 lb Height: 66in Body Surface Area: 2.04 m Body Mass Index: 33.88 kg/m  Temp.: 97.8F  Pulse: 88 (Regular)  BP: 124/88 (Sitting, Left Arm, Standard)       Physical Exam Wayne Stewart(Wayne Oswald M. Brennley Curtice Stewart; 03/14/2018 4:29 PM) General Mental Status-Alert. General Appearance-Consistent with stated age. Hydration-Well hydrated. Voice-Normal.  Head and Neck Head-normocephalic, atraumatic with no lesions or palpable masses. Trachea-midline. Thyroid Gland Characteristics - normal size and consistency.  Eye Eyeball - Bilateral-Extraocular movements intact. Sclera/Conjunctiva - Bilateral-No scleral icterus.  ENMT Mouth and Throat -Note: lips intact.  Note: normal ext ears   Chest and Lung Exam Chest and lung exam reveals -quiet, even and easy respiratory effort with no use of accessory muscles and on auscultation, normal breath sounds, no adventitious sounds and normal vocal resonance. Inspection Chest Wall - Normal. Back - normal.  Breast - Did not examine.  Cardiovascular Cardiovascular examination reveals -normal heart sounds, regular rate and rhythm with no murmurs and normal pedal pulses bilaterally.  Abdomen Inspection Inspection of the abdomen reveals - No Hernias. Skin - Scar - no surgical scars. Palpation/Percussion Palpation and Percussion of the abdomen reveal - Soft, Non Tender, No Rebound tenderness, No  Rigidity (guarding) and No hepatosplenomegaly. Auscultation Auscultation of the abdomen reveals - Bowel sounds normal. Note: old appy scar   Peripheral Vascular Upper Extremity Palpation - Pulses bilaterally normal.  Neurologic Neurologic evaluation reveals -alert and oriented x 3 with no impairment of recent or remote memory. Mental Status-Normal.  Neuropsychiatric The patient's mood and affect are described as -normal. Judgment and Insight-insight is appropriate concerning matters relevant to self.  Musculoskeletal Normal Exam - Left-Upper Extremity Strength Normal and Lower Extremity Strength Normal. Normal Exam - Right-Upper Extremity Strength Normal and Lower Extremity Strength Normal.  Lymphatic Head & Neck  General Head & Neck Lymphatics: Bilateral - Description - Normal. Axillary - Did not examine. Femoral & Inguinal - Did not examine.    Assessment & Plan Wayne Stewart(Sokha Craker M. Josiah Nieto Stewart; 03/14/2018 4:28 PM) SYMPTOMATIC CHOLELITHIASIS (K80.20) Impression: I believe the patient's symptoms are consistent with gallbladder disease.  We discussed gallbladder disease. The patient was given Agricultural engineereducational material. We discussed non-operative and operative management. We discussed the signs & symptoms of acute cholecystitis  I discussed laparoscopic cholecystectomy with IOC in detail. The patient was given educational material as well as diagrams detailing the procedure. We discussed the risks and benefits of a laparoscopic cholecystectomy including, but not limited to bleeding, infection, injury to surrounding structures such as the intestine or liver, bile leak, retained gallstones, need to convert to an open procedure, prolonged diarrhea, blood clots such as DVT, common bile duct injury, anesthesia risks, and possible need for additional procedures. We discussed the typical post-operative recovery course. I explained that the likelihood of improvement of their symptoms is  good.  The patient has elected to proceed with surgery. Current Plans Pt Education - Pamphlet Given - Laparoscopic Gallbladder Surgery: discussed with patient and provided information.  Mary SellaEric M. Wayne CampanileWilson, Stewart, FACS General, Bariatric, & Minimally Invasive Surgery [facility] [place]

## 2018-03-29 NOTE — Pre-Procedure Instructions (Signed)
Wayne Stewart  03/29/2018    Your procedure is scheduled on Tuesday, April 09, 2018 at 7:30 AM.   Report to [facility] Entrance "A" Admitting Office at 5:30 AM.   Call this number if you have problems the morning of surgery: [phone]   Questions prior to day of surgery, please call [phone] between 8 & 4 PM.   Remember:  Do not eat or drink after midnight Monday, 04/08/18.  Take these medicines the morning of surgery with A SIP OF WATER: Omeprazole (Prilosec), Cetirizine (Zyrtec) - if needed, Fluticasone nasal spray - if needed  Stop Fish Oil and NSAIDS (Ibuprofen, Aleve, etc) 7 days prior to surgery.    Do not wear jewelry.  Do not wear lotions, powders, cologne or deodorant.  Men may shave face and neck.  Do not bring valuables to the hospital.  [facility] is not responsible for any belongings or valuables.  Contacts, dentures or bridgework may not be worn into surgery.  Leave your suitcase in the car.  After surgery it may be brought to your room.  For patients admitted to the hospital, discharge time will be determined by your treatment team.  Patients discharged the day of surgery will not be allowed to drive home.   [place] - Preparing for Surgery  Before surgery, you can play an important role.  Because skin is not sterile, your skin needs to be as free of germs as possible.  You can reduce the number of germs on you skin by washing with CHG (chlorahexidine gluconate) soap before surgery.  CHG is an antiseptic cleaner which kills germs and bonds with the skin to continue killing germs even after washing.  Oral Hygiene is also important in reducing the risk of infection.  Remember to brush your teeth with your regular toothpaste the morning of surgery.  Please DO NOT use if you have an allergy to CHG or antibacterial soaps.  If your skin becomes reddened/irritated stop using the CHG and inform your nurse when you arrive at Short Stay.  Do not shave  (including legs and underarms) for at least 48 hours prior to the first CHG shower.  You may shave your face.  Please follow these instructions carefully:   1.  Shower with CHG Soap the night before surgery and the morning of Surgery.  2.  If you choose to wash your hair, wash your hair first as usual with your normal shampoo.  3.  After you shampoo, rinse your hair and body thoroughly to remove the shampoo. 4.  Use CHG as you would any other liquid soap.  You can apply chg directly to the skin and wash gently with a      scrungie or washcloth.           5.  Apply the CHG Soap to your body ONLY FROM THE NECK DOWN.   Do not use on open wounds or open sores. Avoid contact with your eyes, ears, mouth and genitals (private parts).  Wash genitals (private parts) with your normal soap.  6.  Wash thoroughly, paying special attention to the area where your surgery will be performed.  7.  Thoroughly rinse your body with warm water from the neck down.  8.  DO NOT shower/wash with your normal soap after using and rinsing off the CHG Soap.  9.  Pat yourself dry with a clean towel.            10.  Wear clean  pajamas.            11.  Place clean sheets on your bed the night of your first shower and do not sleep with pets.  Day of Surgery  Shower as above.  Do not apply any lotions/deodorants the morning of surgery.   Please wear clean clothes to the hospital. Remember to brush your teeth with toothpaste.   Please read over the fact sheets that you were given.

## 2018-04-01 ENCOUNTER — Other Ambulatory Visit: Payer: Self-pay

## 2018-04-01 ENCOUNTER — Encounter (HOSPITAL_COMMUNITY)
Admission: RE | Admit: 2018-04-01 | Discharge: 2018-04-01 | Disposition: A | Discharge status: Home / Self Care | Payer: BLUE CROSS/BLUE SHIELD | Source: Ambulatory Visit | Attending: General Surgery | Admitting: General Surgery

## 2018-04-01 ENCOUNTER — Encounter (HOSPITAL_COMMUNITY): Payer: Self-pay

## 2018-04-01 DIAGNOSIS — Z01818 Encounter for other preprocedural examination: Secondary | ICD-10-CM | POA: Insufficient documentation

## 2018-04-01 HISTORY — DX: Gastro-esophageal reflux disease without esophagitis: K21.9

## 2018-04-01 HISTORY — DX: Headache, unspecified: R51.9

## 2018-04-01 HISTORY — DX: Essential (primary) hypertension: I10

## 2018-04-01 HISTORY — DX: Headache: R51

## 2018-04-01 HISTORY — DX: Bradycardia, unspecified: R00.1

## 2018-04-01 HISTORY — DX: Fatty (change of) liver, not elsewhere classified: K76.0

## 2018-04-01 LAB — COMPREHENSIVE METABOLIC PANEL
ALK PHOS: 51 U/L (ref 38–126)
ALT: 34 U/L (ref 0–44)
ANION GAP: 8 (ref 5–15)
AST: 27 U/L (ref 15–41)
Albumin: 3.8 g/dL (ref 3.5–5.0)
BILIRUBIN TOTAL: 0.6 mg/dL (ref 0.3–1.2)
BUN: 10 mg/dL (ref 6–20)
CALCIUM: 9.2 mg/dL (ref 8.9–10.3)
CO2: 25 mmol/L (ref 22–32)
CREATININE: 0.92 mg/dL (ref 0.61–1.24)
Chloride: 106 mmol/L (ref 98–111)
GFR calc Af Amer: >60 mL/min (ref >60)
Glucose, Bld: 102 mg/dL — ABNORMAL HIGH (ref 70–99)
Potassium: 4.1 mmol/L (ref 3.5–5.1)
SODIUM: 139 mmol/L (ref 135–145)
TOTAL PROTEIN: 7.3 g/dL (ref 6.5–8.1)

## 2018-04-01 LAB — CBC
HEMATOCRIT: 45.3 % (ref 39.0–52.0)
HEMOGLOBIN: 14.7 g/dL (ref 13.0–17.0)
MCH: 29.4 pg (ref 26.0–34.0)
MCHC: 32.5 g/dL (ref 30.0–36.0)
MCV: 90.6 fL (ref 78.0–100.0)
Platelets: 218 10*3/uL (ref 150–400)
RBC: 5 MIL/uL (ref 4.22–5.81)
RDW: 12.2 % (ref 11.5–15.5)
WBC: 7 10*3/uL (ref 4.0–10.5)

## 2018-04-01 NOTE — Pre-Procedure Instructions (Signed)
Wayne Stewart  04/01/2018    Your procedure is scheduled on Tuesday, April 09, 2018 at 7:30 AM.   [facility]" Admitting Office at 5:30 AM.   Call this number if you have problems the morning of surgery: [phone]   Questions prior to day of surgery, please call [phone] between 8 & 4 PM.   Remember:  Do not eat after midnight Monday, 04/08/18. May have only clear liquids up until 430 am. (water or clear juice apple or cranberry) Drink presurgery ensure by 430 am day of surgery.   Take these medicines the morning of surgery with A SIP OF WATER: Omeprazole (Prilosec), Cetirizine (Zyrtec) - if needed, Fluticasone nasal spray - if needed  Stop Fish Oil and NSAIDS (Ibuprofen, Aleve, etc) 7 days prior to surgery.    Do not wear jewelry.  Do not wear lotions, powders, cologne or deodorant.  Men may shave face and neck.  Do not bring valuables to the hospital.  [facility] is not responsible for any belongings or valuables.  Contacts, dentures or bridgework may not be worn into surgery.  Leave your suitcase in the car.  After surgery it may be brought to your room.  For patients admitted to the hospital, discharge time will be determined by your treatment team.  Patients discharged the day of surgery will not be allowed to drive home.   [place] - Preparing for Surgery  Before surgery, you can play an important role.  Because skin is not sterile, your skin needs to be as free of germs as possible.  You can reduce the number of germs on you skin by washing with CHG (chlorahexidine gluconate) soap before surgery.  CHG is an antiseptic cleaner which kills germs and bonds with the skin to continue killing germs even after washing.  Oral Hygiene is also important in reducing the risk of infection.  Remember to brush your teeth with your regular toothpaste the morning of surgery.  Please DO NOT use if you have an allergy to CHG or antibacterial soaps.  If  your skin becomes reddened/irritated stop using the CHG and inform your nurse when you arrive at Short Stay.  Do not shave (including legs and underarms) for at least 48 hours prior to the first CHG shower.  You may shave your face.  Please follow these instructions carefully:   1.  Shower with CHG Soap the night before surgery and the morning of Surgery.  2.  If you choose to wash your hair, wash your hair first as usual with your normal shampoo.  3.  After you shampoo, rinse your hair and body thoroughly to remove the shampoo. 4.  Use CHG as you would any other liquid soap.  You can apply chg directly to the skin and wash gently with a      scrungie or washcloth.           5.  Apply the CHG Soap to your body ONLY FROM THE NECK DOWN.   Do not use on open wounds or open sores. Avoid contact with your eyes, ears, mouth and genitals (private parts).  Wash genitals (private parts) with your normal soap.  6.  Wash thoroughly, paying special attention to the area where your surgery will be performed.  7.  Thoroughly rinse your body with warm water from the neck down.  8.  DO NOT shower/wash with your normal soap after using and rinsing off the CHG Soap.  9.  Pat yourself dry with a clean towel.            10.  Wear clean pajamas.            11.  Place clean sheets on your bed the night of your first shower and do not sleep with pets.  Day of Surgery  Shower as above.  Do not apply any lotions/deodorants the morning of surgery.   Please wear clean clothes to the hospital. Remember to brush your teeth with toothpaste.   Please read over the fact sheets that you were given.

## 2018-04-08 MED ORDER — SODIUM CHLORIDE 0.9 % IV SOLN
2.0000 g | INTRAVENOUS | Status: AC
  Administered 2018-04-09: 2 g via INTRAVENOUS
  Filled 2018-04-08: qty 2

## 2018-04-09 ENCOUNTER — Ambulatory Visit (HOSPITAL_COMMUNITY): Payer: BLUE CROSS/BLUE SHIELD | Admitting: Certified Registered"

## 2018-04-09 ENCOUNTER — Encounter (HOSPITAL_COMMUNITY)
Admission: RE | Disposition: A | Discharge status: Home / Self Care | Payer: Self-pay | Source: Ambulatory Visit | Attending: General Surgery

## 2018-04-09 ENCOUNTER — Ambulatory Visit (HOSPITAL_COMMUNITY)
Admission: RE | Admit: 2018-04-09 | Discharge: 2018-04-09 | Disposition: A | Discharge status: Home / Self Care | Payer: BLUE CROSS/BLUE SHIELD | Source: Ambulatory Visit | Attending: General Surgery | Admitting: General Surgery

## 2018-04-09 DIAGNOSIS — Z79899 Other long term (current) drug therapy: Secondary | ICD-10-CM | POA: Diagnosis not present

## 2018-04-09 DIAGNOSIS — K219 Gastro-esophageal reflux disease without esophagitis: Secondary | ICD-10-CM | POA: Diagnosis not present

## 2018-04-09 DIAGNOSIS — Z7982 Long term (current) use of aspirin: Secondary | ICD-10-CM | POA: Insufficient documentation

## 2018-04-09 DIAGNOSIS — E7800 Pure hypercholesterolemia, unspecified: Secondary | ICD-10-CM | POA: Diagnosis not present

## 2018-04-09 DIAGNOSIS — K8010 Calculus of gallbladder with chronic cholecystitis without obstruction: Secondary | ICD-10-CM | POA: Insufficient documentation

## 2018-04-09 DIAGNOSIS — K8020 Calculus of gallbladder without cholecystitis without obstruction: Secondary | ICD-10-CM | POA: Diagnosis not present

## 2018-04-09 DIAGNOSIS — I10 Essential (primary) hypertension: Secondary | ICD-10-CM | POA: Diagnosis not present

## 2018-04-09 HISTORY — PX: CHOLECYSTECTOMY: SHX55

## 2018-04-09 SURGERY — LAPAROSCOPIC CHOLECYSTECTOMY
Anesthesia: General | Site: Abdomen

## 2018-04-09 MED ORDER — BUPIVACAINE-EPINEPHRINE (PF) 0.25% -1:200000 IJ SOLN
INTRAMUSCULAR | Status: AC
  Filled 2018-04-09: qty 30

## 2018-04-09 MED ORDER — OXYCODONE HCL 5 MG PO TABS: 5.0000 mg | ORAL_TABLET | Freq: Four times a day (QID) | ORAL | 0 refills | Status: AC | PRN

## 2018-04-09 MED ORDER — ONDANSETRON HCL 4 MG/2ML IJ SOLN
INTRAMUSCULAR | Status: DC | PRN
  Administered 2018-04-09: 4 mg via INTRAVENOUS

## 2018-04-09 MED ORDER — SUGAMMADEX SODIUM 200 MG/2ML IV SOLN
INTRAVENOUS | Status: DC | PRN
  Administered 2018-04-09: 190 mg via INTRAVENOUS

## 2018-04-09 MED ORDER — GABAPENTIN 300 MG PO CAPS
300.0000 mg | ORAL_CAPSULE | ORAL | Status: AC
  Administered 2018-04-09: 300 mg via ORAL
  Filled 2018-04-09: qty 1

## 2018-04-09 MED ORDER — OXYCODONE HCL 5 MG PO TABS
ORAL_TABLET | ORAL | Status: AC
  Filled 2018-04-09: qty 1

## 2018-04-09 MED ORDER — BUPIVACAINE-EPINEPHRINE 0.25% -1:200000 IJ SOLN
INTRAMUSCULAR | Status: DC | PRN
  Administered 2018-04-09: 30 mL

## 2018-04-09 MED ORDER — 0.9 % SODIUM CHLORIDE (POUR BTL) OPTIME
TOPICAL | Status: DC | PRN
  Administered 2018-04-09: 1000 mL

## 2018-04-09 MED ORDER — SODIUM CHLORIDE 0.9 % IR SOLN
Status: DC | PRN
  Administered 2018-04-09: 1000 mL

## 2018-04-09 MED ORDER — LIDOCAINE HCL (CARDIAC) PF 100 MG/5ML IV SOSY
PREFILLED_SYRINGE | INTRAVENOUS | Status: DC | PRN
  Administered 2018-04-09: 60 mg via INTRAVENOUS

## 2018-04-09 MED ORDER — PROPOFOL 10 MG/ML IV BOLUS
INTRAVENOUS | Status: AC
  Filled 2018-04-09: qty 20

## 2018-04-09 MED ORDER — KETOROLAC TROMETHAMINE 30 MG/ML IJ SOLN: 30.0000 mg | Freq: Once | INTRAMUSCULAR | Status: DC | PRN

## 2018-04-09 MED ORDER — ROCURONIUM BROMIDE 100 MG/10ML IV SOLN
INTRAVENOUS | Status: DC | PRN
  Administered 2018-04-09: 60 mg via INTRAVENOUS

## 2018-04-09 MED ORDER — OXYCODONE HCL 5 MG/5ML PO SOLN: 5.0000 mg | Freq: Once | ORAL | Status: AC | PRN

## 2018-04-09 MED ORDER — HYDROMORPHONE HCL 1 MG/ML IJ SOLN: 0.2500 mg | INTRAMUSCULAR | Status: DC | PRN

## 2018-04-09 MED ORDER — PROMETHAZINE HCL 25 MG/ML IJ SOLN: 6.2500 mg | INTRAMUSCULAR | Status: DC | PRN

## 2018-04-09 MED ORDER — PHENYLEPHRINE HCL 10 MG/ML IJ SOLN
INTRAMUSCULAR | Status: DC | PRN
  Administered 2018-04-09: 80 ug via INTRAVENOUS
  Administered 2018-04-09: 160 ug via INTRAVENOUS
  Administered 2018-04-09: 120 ug via INTRAVENOUS
  Administered 2018-04-09: 40 ug via INTRAVENOUS

## 2018-04-09 MED ORDER — PHENYLEPHRINE HCL 10 MG/ML IJ SOLN
INTRAMUSCULAR | Status: DC | PRN
  Administered 2018-04-09: 50 ug/min via INTRAVENOUS

## 2018-04-09 MED ORDER — CHLORHEXIDINE GLUCONATE 4 % EX LIQD: 60.0000 mL | Freq: Once | CUTANEOUS | Status: DC

## 2018-04-09 MED ORDER — PHENYLEPHRINE 40 MCG/ML (10ML) SYRINGE FOR IV PUSH (FOR BLOOD PRESSURE SUPPORT)
PREFILLED_SYRINGE | INTRAVENOUS | Status: AC
  Filled 2018-04-09: qty 10

## 2018-04-09 MED ORDER — PROPOFOL 10 MG/ML IV BOLUS
INTRAVENOUS | Status: DC | PRN
  Administered 2018-04-09: 150 mg via INTRAVENOUS

## 2018-04-09 MED ORDER — OXYCODONE HCL 5 MG PO TABS: 5.0000 mg | ORAL_TABLET | Freq: Once | ORAL | Status: DC

## 2018-04-09 MED ORDER — MIDAZOLAM HCL 2 MG/2ML IJ SOLN
INTRAMUSCULAR | Status: AC
  Filled 2018-04-09: qty 2

## 2018-04-09 MED ORDER — GLYCOPYRROLATE 0.2 MG/ML IJ SOLN
INTRAMUSCULAR | Status: DC | PRN
  Administered 2018-04-09: 0.2 mg via INTRAVENOUS

## 2018-04-09 MED ORDER — OXYCODONE HCL 5 MG PO TABS
5.0000 mg | ORAL_TABLET | Freq: Once | ORAL | Status: AC | PRN
  Administered 2018-04-09: 5 mg via ORAL

## 2018-04-09 MED ORDER — DEXAMETHASONE SODIUM PHOSPHATE 10 MG/ML IJ SOLN
INTRAMUSCULAR | Status: DC | PRN
  Administered 2018-04-09: 10 mg via INTRAVENOUS

## 2018-04-09 MED ORDER — ACETAMINOPHEN 500 MG PO TABS
1000.0000 mg | ORAL_TABLET | ORAL | Status: AC
  Administered 2018-04-09: 1000 mg via ORAL
  Filled 2018-04-09: qty 2

## 2018-04-09 MED ORDER — STERILE WATER FOR IRRIGATION IR SOLN
Status: DC | PRN
  Administered 2018-04-09: 1000 mL

## 2018-04-09 MED ORDER — KETOROLAC TROMETHAMINE 30 MG/ML IJ SOLN
INTRAMUSCULAR | Status: DC | PRN
  Administered 2018-04-09: 30 mg via INTRAVENOUS

## 2018-04-09 MED ORDER — FENTANYL CITRATE (PF) 250 MCG/5ML IJ SOLN
INTRAMUSCULAR | Status: AC
  Filled 2018-04-09: qty 5

## 2018-04-09 MED ORDER — LACTATED RINGERS IV SOLN
INTRAVENOUS | Status: DC | PRN
  Administered 2018-04-09 (×2): via INTRAVENOUS

## 2018-04-09 MED ORDER — MIDAZOLAM HCL 5 MG/5ML IJ SOLN
INTRAMUSCULAR | Status: DC | PRN
  Administered 2018-04-09: 2 mg via INTRAVENOUS

## 2018-04-09 MED ORDER — FENTANYL CITRATE (PF) 100 MCG/2ML IJ SOLN
INTRAMUSCULAR | Status: DC | PRN
  Administered 2018-04-09 (×2): 50 ug via INTRAVENOUS

## 2018-04-09 SURGICAL SUPPLY — 42 items
APPLIER CLIP 5 13 M/L LIGAMAX5 (MISCELLANEOUS) ×3
BANDAGE ADH SHEER 1  50/CT (GAUZE/BANDAGES/DRESSINGS) ×9 IMPLANT
BENZOIN TINCTURE PRP APPL 2/3 (GAUZE/BANDAGES/DRESSINGS) ×3 IMPLANT
BLADE CLIPPER SURG (BLADE) ×3 IMPLANT
CANISTER SUCT 3000ML PPV (MISCELLANEOUS) ×3 IMPLANT
CHLORAPREP W/TINT 26ML (MISCELLANEOUS) ×3 IMPLANT
CLIP APPLIE 5 13 M/L LIGAMAX5 (MISCELLANEOUS) ×1 IMPLANT
CLOSURE WOUND 1/2 X4 (GAUZE/BANDAGES/DRESSINGS) ×1
COVER SURGICAL LIGHT HANDLE (MISCELLANEOUS) ×3 IMPLANT
DRAPE LAPAROSCOPIC ABDOMINAL (DRAPES) ×3 IMPLANT
DRSG TEGADERM 4X4.75 (GAUZE/BANDAGES/DRESSINGS) ×3 IMPLANT
ELECT REM PT RETURN 9FT ADLT (ELECTROSURGICAL) ×3
ELECTRODE REM PT RTRN 9FT ADLT (ELECTROSURGICAL) ×1 IMPLANT
GAUZE SPONGE 2X2 8PLY STRL LF (GAUZE/BANDAGES/DRESSINGS) ×1 IMPLANT
GLOVE BIOGEL M STRL SZ7.5 (GLOVE) ×3 IMPLANT
GLOVE BIOGEL PI IND STRL 8 (GLOVE) ×2 IMPLANT
GLOVE BIOGEL PI INDICATOR 8 (GLOVE) ×4
GOWN STRL REUS W/ TWL LRG LVL3 (GOWN DISPOSABLE) ×2 IMPLANT
GOWN STRL REUS W/TWL 2XL LVL3 (GOWN DISPOSABLE) ×3 IMPLANT
GOWN STRL REUS W/TWL LRG LVL3 (GOWN DISPOSABLE) ×4
GRASPER SUT TROCAR 14GX15 (MISCELLANEOUS) ×3 IMPLANT
KIT BASIN OR (CUSTOM PROCEDURE TRAY) ×3 IMPLANT
KIT TURNOVER KIT B (KITS) ×3 IMPLANT
NS IRRIG 1000ML POUR BTL (IV SOLUTION) ×3 IMPLANT
PAD ARMBOARD 7.5X6 YLW CONV (MISCELLANEOUS) ×3 IMPLANT
POUCH RETRIEVAL ECOSAC 10 (ENDOMECHANICALS) ×1 IMPLANT
POUCH RETRIEVAL ECOSAC 10MM (ENDOMECHANICALS) ×2
SCISSORS LAP 5X35 DISP (ENDOMECHANICALS) ×3 IMPLANT
SET IRRIG TUBING LAPAROSCOPIC (IRRIGATION / IRRIGATOR) ×3 IMPLANT
SLEEVE ENDOPATH XCEL 5M (ENDOMECHANICALS) ×6 IMPLANT
SPECIMEN JAR SMALL (MISCELLANEOUS) ×3 IMPLANT
SPONGE GAUZE 2X2 STER 10/PKG (GAUZE/BANDAGES/DRESSINGS) ×2
STRIP CLOSURE SKIN 1/2X4 (GAUZE/BANDAGES/DRESSINGS) ×2 IMPLANT
SUT MNCRL AB 4-0 PS2 18 (SUTURE) ×3 IMPLANT
SUT VICRYL 0 UR6 27IN ABS (SUTURE) ×3 IMPLANT
TOWEL OR 17X24 6PK STRL BLUE (TOWEL DISPOSABLE) ×3 IMPLANT
TOWEL OR 17X26 10 PK STRL BLUE (TOWEL DISPOSABLE) ×3 IMPLANT
TRAY LAPAROSCOPIC MC (CUSTOM PROCEDURE TRAY) ×3 IMPLANT
TROCAR XCEL BLUNT TIP 100MML (ENDOMECHANICALS) ×3 IMPLANT
TROCAR XCEL NON-BLD 5MMX100MML (ENDOMECHANICALS) ×3 IMPLANT
TUBING INSUFFLATION (TUBING) ×3 IMPLANT
WATER STERILE IRR 1000ML POUR (IV SOLUTION) ×3 IMPLANT

## 2018-04-09 NOTE — Anesthesia Preprocedure Evaluation (Signed)
Anesthesia Evaluation  Patient identified by MRN, date of birth, ID band Patient awake    Reviewed: Allergy & Precautions, NPO status , Patient's Chart, lab work & pertinent test results  Airway Mallampati: II  TM Distance: >3 FB Neck ROM: Full    Dental no notable dental hx.    Pulmonary neg pulmonary ROS,    Pulmonary exam normal breath sounds clear to auscultation       Cardiovascular hypertension, Normal cardiovascular exam Rhythm:Regular Rate:Normal     Neuro/Psych negative neurological ROS  negative psych ROS   GI/Hepatic Neg liver ROS, GERD  ,  Endo/Other  negative endocrine ROS  Renal/GU negative Renal ROS  negative genitourinary   Musculoskeletal negative musculoskeletal ROS (+)   Abdominal   Peds negative pediatric ROS (+)  Hematology negative hematology ROS (+)   Anesthesia Other Findings   Reproductive/Obstetrics negative OB ROS                             Anesthesia Physical Anesthesia Plan  ASA: II  Anesthesia Plan: General   Post-op Pain Management:    Induction: Intravenous  PONV Risk Score and Plan: 2 and Ondansetron, Dexamethasone and Treatment may vary due to age or medical condition  Airway Management Planned: Oral ETT  Additional Equipment:   Intra-op Plan:   Post-operative Plan: Extubation in OR  Informed Consent: I have reviewed the patients History and Physical, chart, labs and discussed the procedure including the risks, benefits and alternatives for the proposed anesthesia with the patient or authorized representative who has indicated his/her understanding and acceptance.   Dental advisory given  Plan Discussed with: CRNA and Surgeon  Anesthesia Plan Comments:         Anesthesia Quick Evaluation  

## 2018-04-09 NOTE — Progress Notes (Signed)
l hand piv unable to be salvaged/ kinked x 2  Under tegaderm/ removed  / given po meds for pain

## 2018-04-09 NOTE — Anesthesia Procedure Notes (Signed)
Procedure Name: Intubation Date/Time: 04/09/2018 7:40 AM Performed by: Rosiland OzMeyers, Claudean Leavelle, CRNA Pre-anesthesia Checklist: Patient identified, Emergency Drugs available, Suction available, Patient being monitored and Timeout performed Patient Re-evaluated:Patient Re-evaluated prior to induction Oxygen Delivery Method: Circle system utilized Preoxygenation: Pre-oxygenation with 100% oxygen Induction Type: IV induction Ventilation: Mask ventilation without difficulty Laryngoscope Size: Miller and 3 Grade View: Grade I Tube type: Oral Tube size: 7.0 mm Number of attempts: 1 Airway Equipment and Method: Stylet Placement Confirmation: ETT inserted through vocal cords under direct vision,  positive ETCO2 and breath sounds checked- equal and bilateral Secured at: 21 cm Tube secured with: Tape Dental Injury: Teeth and Oropharynx as per pre-operative assessment

## 2018-04-09 NOTE — Interval H&P Note (Signed)
History and Physical Interval Note:  04/09/2018 7:10 AM  Wayne Stewart  has presented today for surgery, with the diagnosis of Symptomatic cholelithiasis  The various methods of treatment have been discussed with the patient and family. After consideration of risks, benefits and other options for treatment, the patient has consented to  Procedure(s): LAPAROSCOPIC CHOLECYSTECTOMY ERAS PATHWAY (N/A) as a surgical intervention .  The patient's history has been reviewed, patient examined, no change in status, stable for surgery.  I have reviewed the patient's chart and labs.  Questions were answered to the patient's satisfaction.     Gaynelle AduEric Jona Erkkila

## 2018-04-09 NOTE — Discharge Instructions (Signed)
[facility] °LAPAROSCOPIC SURGERY: POST OP INSTRUCTIONS °Always review your discharge instruction sheet given to you by the facility where your surgery was performed. °IF YOU HAVE DISABILITY OR FAMILY LEAVE FORMS, YOU MUST BRING THEM TO THE OFFICE FOR PROCESSING.   °DO NOT GIVE THEM TO YOUR DOCTOR. ° °PAIN CONTROL ° °1. First take acetaminophen (Tylenol) AND/or ibuprofen (Advil) to control your pain after surgery.  Follow directions on package.  Taking acetaminophen (Tylenol) and/or ibuprofen (Advil) regularly after surgery will help to control your pain and lower the amount of prescription pain medication you may need.  You should not take more than 4,000 mg (4 grams) of acetaminophen (Tylenol) in 24 hours.  You should not take ibuprofen (Advil), aleve, motrin, naprosyn or other NSAIDS if you have a history of stomach ulcers or chronic kidney disease.  °2. A prescription for pain medication may be given to you upon discharge.  Take your pain medication as prescribed, if you still have uncontrolled pain after taking acetaminophen (Tylenol) or ibuprofen (Advil). °3. Use ice packs to help control pain. °4. If you need a refill on your pain medication, please contact your pharmacy.  They will contact our office to request authorization. Prescriptions will not be filled after 5pm or on week-ends. ° °HOME MEDICATIONS °5. Take your usually prescribed medications unless otherwise directed. ° °DIET °6. You should follow a light diet the first few days after arrival home.  Be sure to include lots of fluids daily. Avoid fatty, fried foods.  ° °CONSTIPATION °7. It is common to experience some constipation after surgery and if you are taking pain medication.  Increasing fluid intake and taking a stool softener (such as Colace) will usually help or prevent this problem from occurring.  A mild laxative (Milk of Magnesia or Miralax) should be taken according to package instructions if there are no bowel  movements after 48 hours. ° °WOUND/INCISION CARE °8. Most patients will experience some swelling and bruising in the area of the incisions.  Ice packs will help.  Swelling and bruising can take several days to resolve.  °9. Unless discharge instructions indicate otherwise, follow guidelines below  °a. STERI-STRIPS - you may remove your outer bandages 48 hours after surgery, and you may shower at that time.  You have steri-strips (small skin tapes) in place directly over the incision.  These strips should be left on the skin for 7-10 days.   °b. DERMABOND/SKIN GLUE - you may shower in 24 hours.  The glue will flake off over the next 2-3 weeks. °10. Any sutures or staples will be removed at the office during your follow-up visit. ° °ACTIVITIES °11. You may resume regular (light) daily activities beginning the next day--such as daily self-care, walking, climbing stairs--gradually increasing activities as tolerated.  You may have sexual intercourse when it is comfortable.  Refrain from any heavy lifting or straining until approved by your doctor. °a. You may drive when you are no longer taking prescription pain medication, you can comfortably wear a seatbelt, and you can safely maneuver your car and apply brakes. ° °FOLLOW-UP °12. You should see your doctor in the office for a follow-up appointment approximately 2-3 weeks after your surgery.  You should have been given your post-op/follow-up appointment when your surgery was scheduled.  If you did not receive a post-op/follow-up appointment, make sure that you call for this appointment within a day or two after you arrive home to insure a convenient appointment time. ° °OTHER   INSTRUCTIONS °13.  ° °WHEN TO CALL YOUR DOCTOR: °1. Fever over 101.0 °2. Inability to urinate °3. Continued bleeding from incision. °4. Increased pain, redness, or drainage from the incision. °5. Increasing abdominal pain ° °The clinic staff is available to answer your questions during regular  business hours.  Please don’t hesitate to call and ask to speak to one of the nurses for clinical concerns.  If you have a medical emergency, go to the nearest emergency room or call 911.  A surgeon from [facility] is always on call at the hospital. °[address], [address], [place], [place]  [address] ? [address] [address], [place], [place]   [address] °[phone] ? [phone] ? [phone] [phone] °Web site: [email or web address] ° °

## 2018-04-09 NOTE — Op Note (Signed)
Wayne Stewart [id number] 28-Dec-1975 04/09/2018  Laparoscopic Cholecystectomy Procedure Note  Indications: This patient presents with symptomatic gallbladder disease and will undergo laparoscopic cholecystectomy.  Pre-operative Diagnosis: symptomatic cholelithiasis  Post-operative Diagnosis: Same  Surgeon: Gaynelle AduEric Iviona Hole MD FACS  Assistants: none  Anesthesia: General endotracheal anesthesia  Procedure Details  The patient was seen again in the Holding Room. The risks, benefits, complications, treatment options, and expected outcomes were discussed with the patient. The possibilities of reaction to medication, pulmonary aspiration, perforation of viscus, bleeding, recurrent infection, finding a normal gallbladder, the need for additional procedures, failure to diagnose a condition, the possible need to convert to an open procedure, and creating a complication requiring transfusion or operation were discussed with the patient. The likelihood of improving the patient's symptoms with return to their baseline status is good.  The patient and/or family concurred with the proposed plan, giving informed consent. The site of surgery properly noted. The patient was taken to Operating Room, identified as Wayne Stewart and the procedure verified as Laparoscopic Cholecystectomy. A Time Out was held and the above information confirmed. Antibiotic prophylaxis was administered. He had received preop enhanced recovery meds (tylenol and gabapentin)  Prior to the induction of general anesthesia, antibiotic prophylaxis was administered. General endotracheal anesthesia was then administered and tolerated well. After the induction, the abdomen was prepped with Chloraprep and draped in the sterile fashion. The patient was positioned in the supine position.  Local anesthetic agent was injected into the skin near the umbilicus and an incision made. We dissected down to the abdominal fascia with blunt dissection.  The fascia  was incised vertically and we entered the peritoneal cavity bluntly.  A pursestring suture of 0-Vicryl was placed around the fascial opening.  The Hasson cannula was inserted and secured with the stay suture.  Pneumoperitoneum was then created with CO2 and tolerated well without any adverse changes in the patient's vital signs. An 5-mm port was placed in the subxiphoid position.  Two 5-mm ports were placed in the right upper quadrant. All skin incisions were infiltrated with a local anesthetic agent before making the incision and placing the trocars.   We positioned the patient in reverse Trendelenburg, tilted slightly to the patient's left.  The gallbladder was identified, the fundus grasped and retracted cephalad. There were some omental adhesions to the edge of right lobe which had to be taken down in order to retract the gallbladder. The gallbladder was thin walled. Adhesions were lysed bluntly and with the electrocautery where indicated, taking care not to injure any adjacent organs or viscus. The infundibulum was grasped and retracted laterally, exposing the peritoneum overlying the triangle of Calot. This was then divided and exposed in a blunt fashion. A critical view of the cystic duct and cystic artery was obtained.  The cystic duct was clearly identified and bluntly dissected circumferentially. The cystic duct was ligated with a clip distally.     The cystic duct was then ligated with clips and divided. The cystic artery which had been identified & dissected free was ligated with clips and divided as well. There was a tiny posterior branch going to the gallbladder which was clipped.   The gallbladder was dissected from the liver bed in retrograde fashion with the electrocautery. The gallbladder was removed and placed in an Ecco sac.  The gallbladder and Ecco sac were then removed through the umbilical port site. The liver bed was irrigated and inspected. Hemostasis was achieved with the  electrocautery. Copious irrigation was utilized and  was repeatedly aspirated until clear.  The pursestring suture was used to close the umbilical fascia.    We again inspected the right upper quadrant for hemostasis.  The umbilical closure was inspected and there was no air leak and nothing trapped within the closure. Pneumoperitoneum was released as we removed the trocars.  4-0 Monocryl was used to close the skin.   Benzoin, steri-strips, and clean dressings were applied. The patient was then extubated and brought to the recovery room in stable condition. Instrument, sponge, and needle counts were correct at closure and at the conclusion of the case.   Findings: Chronic Cholecystitis with Cholelithiasis +critical view  Estimated Blood Loss: Minimal         Drains: none         Specimens: Gallbladder           Complications: None; patient tolerated the procedure well.         Disposition: PACU - hemodynamically stable.         Condition: stable  Mary Sella. Andrey Campanile, MD, FACS General, Bariatric, & Minimally Invasive Surgery [facility], [place]

## 2018-04-09 NOTE — Transfer of Care (Signed)
Immediate Anesthesia Transfer of Care Note  Patient: Wayne Stewart  Procedure(s) Performed: LAPAROSCOPIC CHOLECYSTECTOMY ERAS PATHWAY (N/A Abdomen)  Patient Location: PACU  Anesthesia Type:General  Level of Consciousness: awake and patient cooperative  Airway & Oxygen Therapy: Patient Spontanous Breathing  Post-op Assessment: Report given to RN and Post -op Vital signs reviewed and stable  Post vital signs: Reviewed and stable  Last Vitals:  Vitals Value Taken Time  BP 133/100 04/09/2018  8:54 AM  Temp    Pulse 65 04/09/2018  8:55 AM  Resp 16 04/09/2018  8:55 AM  SpO2 97 % 04/09/2018  8:55 AM  Vitals shown include unvalidated device data.  Last Pain:  Vitals:   04/09/18 0609  TempSrc: Oral         Complications: No apparent anesthesia complications

## 2018-04-09 NOTE — Anesthesia Postprocedure Evaluation (Signed)
Anesthesia Post Note  Patient: Wayne Stewart  Procedure(s) Performed: LAPAROSCOPIC CHOLECYSTECTOMY ERAS PATHWAY (N/A Abdomen)     Patient location during evaluation: PACU Anesthesia Type: General Level of consciousness: awake and alert Pain management: pain level controlled Vital Signs Assessment: post-procedure vital signs reviewed and stable Respiratory status: spontaneous breathing, nonlabored ventilation, respiratory function stable and patient connected to nasal cannula oxygen Cardiovascular status: blood pressure returned to baseline and stable Postop Assessment: no apparent nausea or vomiting Anesthetic complications: no    Last Vitals:  Vitals:   04/09/18 0912 04/09/18 0937  BP: 114/64 (!) 113/54  Pulse: 60 (!) 56  Resp: 16 16  Temp:  (!) 36.4 C  SpO2: 91% 98%    Last Pain:  Vitals:   04/09/18 0853  TempSrc:   PainSc: Asleep                 [name] S

## 2018-04-10 ENCOUNTER — Encounter (HOSPITAL_COMMUNITY): Payer: Self-pay | Admitting: General Surgery

## 2018-07-31 DIAGNOSIS — Z13220 Encounter for screening for lipoid disorders: Secondary | ICD-10-CM | POA: Diagnosis not present

## 2018-07-31 DIAGNOSIS — Z0000 Encounter for general adult medical examination without abnormal findings: Secondary | ICD-10-CM | POA: Diagnosis not present

## 2018-07-31 DIAGNOSIS — Z125 Encounter for screening for malignant neoplasm of prostate: Secondary | ICD-10-CM | POA: Diagnosis not present

## 2018-08-02 DIAGNOSIS — Z6833 Body mass index (BMI) 33.0-33.9, adult: Secondary | ICD-10-CM | POA: Diagnosis not present

## 2018-08-02 DIAGNOSIS — Z0000 Encounter for general adult medical examination without abnormal findings: Secondary | ICD-10-CM | POA: Diagnosis not present

## 2018-08-02 DIAGNOSIS — Z23 Encounter for immunization: Secondary | ICD-10-CM | POA: Diagnosis not present

## 2018-11-01 DIAGNOSIS — I10 Essential (primary) hypertension: Secondary | ICD-10-CM | POA: Diagnosis not present

## 2018-11-01 DIAGNOSIS — K219 Gastro-esophageal reflux disease without esophagitis: Secondary | ICD-10-CM | POA: Diagnosis not present

## 2018-11-01 DIAGNOSIS — R635 Abnormal weight gain: Secondary | ICD-10-CM | POA: Diagnosis not present

## 2018-11-01 DIAGNOSIS — Z6834 Body mass index (BMI) 34.0-34.9, adult: Secondary | ICD-10-CM | POA: Diagnosis not present

## 2018-11-26 DIAGNOSIS — Z6833 Body mass index (BMI) 33.0-33.9, adult: Secondary | ICD-10-CM | POA: Diagnosis not present

## 2018-11-26 DIAGNOSIS — I10 Essential (primary) hypertension: Secondary | ICD-10-CM | POA: Diagnosis not present

## 2018-11-26 DIAGNOSIS — K219 Gastro-esophageal reflux disease without esophagitis: Secondary | ICD-10-CM | POA: Diagnosis not present

## 2018-11-26 DIAGNOSIS — R635 Abnormal weight gain: Secondary | ICD-10-CM | POA: Diagnosis not present

## 2018-12-06 DIAGNOSIS — Z6833 Body mass index (BMI) 33.0-33.9, adult: Secondary | ICD-10-CM | POA: Diagnosis not present

## 2018-12-06 DIAGNOSIS — J069 Acute upper respiratory infection, unspecified: Secondary | ICD-10-CM | POA: Diagnosis not present

## 2019-01-06 DIAGNOSIS — K219 Gastro-esophageal reflux disease without esophagitis: Secondary | ICD-10-CM | POA: Diagnosis not present

## 2019-01-06 DIAGNOSIS — I10 Essential (primary) hypertension: Secondary | ICD-10-CM | POA: Diagnosis not present

## 2019-01-06 DIAGNOSIS — Z719 Counseling, unspecified: Secondary | ICD-10-CM | POA: Diagnosis not present

## 2019-01-06 DIAGNOSIS — R635 Abnormal weight gain: Secondary | ICD-10-CM | POA: Diagnosis not present

## 2019-03-04 DIAGNOSIS — I10 Essential (primary) hypertension: Secondary | ICD-10-CM | POA: Diagnosis not present

## 2019-03-04 DIAGNOSIS — R079 Chest pain, unspecified: Secondary | ICD-10-CM | POA: Diagnosis not present

## 2019-03-04 DIAGNOSIS — K219 Gastro-esophageal reflux disease without esophagitis: Secondary | ICD-10-CM | POA: Diagnosis not present

## 2019-03-04 DIAGNOSIS — E782 Mixed hyperlipidemia: Secondary | ICD-10-CM | POA: Diagnosis not present

## 2019-03-06 DIAGNOSIS — K219 Gastro-esophageal reflux disease without esophagitis: Secondary | ICD-10-CM | POA: Diagnosis not present

## 2019-03-06 DIAGNOSIS — H9203 Otalgia, bilateral: Secondary | ICD-10-CM | POA: Diagnosis not present

## 2019-03-06 DIAGNOSIS — R079 Chest pain, unspecified: Secondary | ICD-10-CM | POA: Diagnosis not present

## 2019-04-18 DIAGNOSIS — Z6833 Body mass index (BMI) 33.0-33.9, adult: Secondary | ICD-10-CM | POA: Diagnosis not present

## 2019-04-18 DIAGNOSIS — I10 Essential (primary) hypertension: Secondary | ICD-10-CM | POA: Diagnosis not present

## 2019-04-18 DIAGNOSIS — K219 Gastro-esophageal reflux disease without esophagitis: Secondary | ICD-10-CM | POA: Diagnosis not present

## 2019-04-18 DIAGNOSIS — E663 Overweight: Secondary | ICD-10-CM | POA: Diagnosis not present

## 2019-08-27 ENCOUNTER — Other Ambulatory Visit: Payer: Self-pay

## 2019-08-27 DIAGNOSIS — U071 COVID-19: Secondary | ICD-10-CM | POA: Diagnosis not present

## 2019-08-27 DIAGNOSIS — K219 Gastro-esophageal reflux disease without esophagitis: Secondary | ICD-10-CM | POA: Diagnosis not present

## 2019-08-27 DIAGNOSIS — I10 Essential (primary) hypertension: Secondary | ICD-10-CM | POA: Diagnosis not present

## 2019-08-27 DIAGNOSIS — E782 Mixed hyperlipidemia: Secondary | ICD-10-CM | POA: Diagnosis not present

## 2019-08-27 DIAGNOSIS — Z20822 Contact with and (suspected) exposure to covid-19: Secondary | ICD-10-CM

## 2019-08-31 LAB — NOVEL CORONAVIRUS, NAA: SARS-CoV-2, NAA: DETECTED — AB

## 2019-09-01 ENCOUNTER — Telehealth: Payer: Self-pay | Admitting: Critical Care Medicine

## 2019-09-01 DIAGNOSIS — R630 Anorexia: Secondary | ICD-10-CM | POA: Diagnosis not present

## 2019-09-01 DIAGNOSIS — R5383 Other fatigue: Secondary | ICD-10-CM | POA: Diagnosis not present

## 2019-09-01 DIAGNOSIS — U071 COVID-19: Secondary | ICD-10-CM | POA: Diagnosis not present

## 2019-09-01 DIAGNOSIS — R519 Headache, unspecified: Secondary | ICD-10-CM | POA: Diagnosis not present

## 2019-09-01 NOTE — Telephone Encounter (Signed)
I spoke to this patient who is Covid positive from December 2.  He is rapidly improving.  He is not a monoclonal antibody candidate.  He knows to stay in isolation till December 11.

## 2019-09-05 DIAGNOSIS — U071 COVID-19: Secondary | ICD-10-CM | POA: Diagnosis not present

## 2019-09-05 DIAGNOSIS — S29012D Strain of muscle and tendon of back wall of thorax, subsequent encounter: Secondary | ICD-10-CM | POA: Diagnosis not present

## 2019-09-05 DIAGNOSIS — R519 Headache, unspecified: Secondary | ICD-10-CM | POA: Diagnosis not present

## 2019-09-05 DIAGNOSIS — J189 Pneumonia, unspecified organism: Secondary | ICD-10-CM | POA: Diagnosis not present

## 2019-11-24 DIAGNOSIS — I10 Essential (primary) hypertension: Secondary | ICD-10-CM | POA: Diagnosis not present

## 2019-11-24 DIAGNOSIS — E782 Mixed hyperlipidemia: Secondary | ICD-10-CM | POA: Diagnosis not present

## 2019-11-24 DIAGNOSIS — Z6832 Body mass index (BMI) 32.0-32.9, adult: Secondary | ICD-10-CM | POA: Diagnosis not present

## 2019-11-24 DIAGNOSIS — K219 Gastro-esophageal reflux disease without esophagitis: Secondary | ICD-10-CM | POA: Diagnosis not present

## 2019-11-25 DIAGNOSIS — E782 Mixed hyperlipidemia: Secondary | ICD-10-CM | POA: Diagnosis not present

## 2020-01-16 DIAGNOSIS — Z125 Encounter for screening for malignant neoplasm of prostate: Secondary | ICD-10-CM | POA: Diagnosis not present

## 2020-01-16 DIAGNOSIS — Z0184 Encounter for antibody response examination: Secondary | ICD-10-CM | POA: Diagnosis not present

## 2020-01-16 DIAGNOSIS — Z0000 Encounter for general adult medical examination without abnormal findings: Secondary | ICD-10-CM | POA: Diagnosis not present

## 2020-01-20 DIAGNOSIS — Z0000 Encounter for general adult medical examination without abnormal findings: Secondary | ICD-10-CM | POA: Diagnosis not present

## 2020-01-20 DIAGNOSIS — Z6832 Body mass index (BMI) 32.0-32.9, adult: Secondary | ICD-10-CM | POA: Diagnosis not present

## 2020-02-24 DIAGNOSIS — Z23 Encounter for immunization: Secondary | ICD-10-CM | POA: Diagnosis not present

## 2020-11-01 DIAGNOSIS — B351 Tinea unguium: Secondary | ICD-10-CM | POA: Diagnosis not present

## 2020-11-01 DIAGNOSIS — K219 Gastro-esophageal reflux disease without esophagitis: Secondary | ICD-10-CM | POA: Diagnosis not present

## 2020-11-01 DIAGNOSIS — I10 Essential (primary) hypertension: Secondary | ICD-10-CM | POA: Diagnosis not present

## 2020-12-14 DIAGNOSIS — I10 Essential (primary) hypertension: Secondary | ICD-10-CM | POA: Diagnosis not present

## 2020-12-14 DIAGNOSIS — J309 Allergic rhinitis, unspecified: Secondary | ICD-10-CM | POA: Diagnosis not present

## 2020-12-14 DIAGNOSIS — K219 Gastro-esophageal reflux disease without esophagitis: Secondary | ICD-10-CM | POA: Diagnosis not present

## 2021-03-09 DIAGNOSIS — M549 Dorsalgia, unspecified: Secondary | ICD-10-CM | POA: Diagnosis not present

## 2021-03-09 DIAGNOSIS — M25512 Pain in left shoulder: Secondary | ICD-10-CM | POA: Diagnosis not present

## 2021-03-09 DIAGNOSIS — G8929 Other chronic pain: Secondary | ICD-10-CM | POA: Diagnosis not present

## 2021-03-09 DIAGNOSIS — M542 Cervicalgia: Secondary | ICD-10-CM | POA: Diagnosis not present

## 2021-03-23 DIAGNOSIS — M542 Cervicalgia: Secondary | ICD-10-CM | POA: Diagnosis not present

## 2021-03-23 DIAGNOSIS — G8929 Other chronic pain: Secondary | ICD-10-CM | POA: Diagnosis not present

## 2021-03-23 DIAGNOSIS — M25512 Pain in left shoulder: Secondary | ICD-10-CM | POA: Diagnosis not present

## 2021-03-23 DIAGNOSIS — M549 Dorsalgia, unspecified: Secondary | ICD-10-CM | POA: Diagnosis not present

## 2021-04-07 ENCOUNTER — Ambulatory Visit: Payer: BC Managed Care – PPO | Attending: Family Medicine

## 2021-04-07 ENCOUNTER — Other Ambulatory Visit: Payer: Self-pay

## 2021-04-07 DIAGNOSIS — R293 Abnormal posture: Secondary | ICD-10-CM | POA: Insufficient documentation

## 2021-04-07 DIAGNOSIS — M62838 Other muscle spasm: Secondary | ICD-10-CM | POA: Diagnosis not present

## 2021-04-07 DIAGNOSIS — R29898 Other symptoms and signs involving the musculoskeletal system: Secondary | ICD-10-CM | POA: Insufficient documentation

## 2021-04-07 DIAGNOSIS — M542 Cervicalgia: Secondary | ICD-10-CM | POA: Diagnosis not present

## 2021-04-08 DIAGNOSIS — M5412 Radiculopathy, cervical region: Secondary | ICD-10-CM | POA: Diagnosis not present

## 2021-04-08 NOTE — Therapy (Addendum)
[place], [place], [address] Phone: [phone]   Fax:  [phone]  Physical Therapy Evaluation/Discharge  Patient Details  Name: Wayne Stewart MRN: [id number] Date of Birth: 1975/11/30 Referring Provider (PT): Fanny Bien, MD   Encounter Date: 04/07/2021   PT End of Session - 04/08/21 0754     Visit Number 1    Number of Visits 8    Date for PT Re-Evaluation 05/14/21    Authorization Type BCBS COMM PPO    Progress Note Due on Visit 10    PT Start Time 0848    PT Stop Time 0940    PT Time Calculation (min) 52 min    Activity Tolerance Patient tolerated treatment well    Behavior During Therapy [facility] for tasks assessed/performed             Past Medical History:  Diagnosis Date   Bradycardia    Fatty liver    GERD (gastroesophageal reflux disease)    Headache    Hypertension     Past Surgical History:  Procedure Laterality Date   APPENDECTOMY     as child    CHOLECYSTECTOMY N/A 04/09/2018   Procedure: LAPAROSCOPIC CHOLECYSTECTOMY ERAS PATHWAY;  Surgeon: Greer Pickerel, MD;  Location: [facility];  Service: General;  Laterality: N/A;    There were no vitals filed for this visit.    Subjective Assessment - 04/07/21 0854     Subjective Pt reports L neck/lateral shoulder pain for approx. 3 months which he has experienced in the past, but it normally goes away. Pt notes the pain has continued. it started in his neck and then after using a massage gun on his upper trap, the pain developed and has remained in his L lateral shoulder and occasionally into his L arm c associated tingling.Pt is not able to recall a MOI. Pain can vary from day to day. Medications have been helpful, but have not completely resolved the pain.    How long can you sit comfortably? Not really    How long can you stand comfortably? Not sure, increases with arm not supported    How long can you walk comfortably? not really     Patient Stated Goals Would like the pain to resolve. To play with children with this pain.    Currently in Pain? Yes    Pain Score 4    2-6/10   Pain Location Neck    Pain Orientation Left    Pain Descriptors / Indicators Aching;Tingling;Throbbing    Pain Type Chronic pain    Pain Onset More than a month ago    Pain Frequency Constant    Aggravating Factors  Looking up at cell phone while lying in bed    Pain Relieving Factors Medications-ibuprofen                [facility] PT Assessment - 04/08/21 0001       Assessment   Medical Diagnosis Cervicalgia    Referring Provider (PT) Fanny Bien, MD    Onset Date/Surgical Date --   approx 3 months ago   Hand Dominance Right    Prior Therapy No      Precautions   Precautions None      Restrictions   Weight Bearing Restrictions No      Balance Screen   Has the patient fallen in the past 6 months No      [place]  residence    Living Arrangements Spouse/significant other;Children      Prior Function   Level of Independence Independent    Vocation Full time employment    Engineer, 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   Overall Cognitive Status Within Functional Limits for tasks assessed      Observation/Other Assessments   Observations Holds head tilted to the R    Focus on Therapeutic Outcomes (FOTO)  Neck 58%, shoulder 54% functional ability      Sensation   Light Touch Appears Intact      Posture/Postural Control   Posture/Postural Control Postural limitations    Postural Limitations Forward head   holds head tilted R     Deep Tendon Reflexes   DTR Assessment Site Biceps;Brachioradialis;Triceps    Biceps DTR 2+    Brachioradialis DTR 2+    Triceps DTR 2+      ROM / Strength   AROM / PROM / Strength AROM      AROM   AROM Assessment Site Cervical    Cervical Flexion 52d increase L cervical and lateral shoulder pain    Cervical  Extension 50d increase L cervical and lateral shoulder pain    Cervical - Right Side Bend 30d increase L upper trap pain    Cervical - Left Side Bend 25d increase L cervical and lateral shoulder    Cervical - Right Rotation 50d increase L cervical and lateral shoulder    Cervical - Left Rotation 60d increase L upper trap pain      Palpation   Palpation comment TTP to paraspinals L C6-T1 and to upper traps      Special Tests    Special Tests Cervical    Cervical Tests Spurling's      Spurling's   Findings Positive    Side Left      Transfers   Transfers Sit to Stand;Stand to Sit    Sit to Stand 7: Independent      Ambulation/Gait   Gait Pattern Within Functional Limits;Step-through pattern                        Objective measurements completed on examination: See above findings.               PT Education - 04/08/21 0752     Education Details Eval findings, POC, proper posture, sleeping positions and support for neutral cervical spine    Person(s) Educated Patient    Methods Explanation;Demonstration;Tactile cues;Verbal cues;Handout    Comprehension Verbalized understanding;Returned demonstration;Verbal cues required;Tactile cues required;Need further instruction              PT Short Term Goals - 04/08/21 1430       PT SHORT TERM GOAL #1   Title Pt will be Ind in an initial HEP    Status New    Target Date 04/22/21      PT SHORT TERM GOAL #2   Title Pt will voice understanding of measures to assist in the reduction of cervical pain.    Status New    Target Date 04/22/21               PT Long Term Goals - 04/08/21 1433       PT LONG TERM GOAL #1   Title Pt will be Ind in a final HEP to maintain or progress achieved for LOF    Status New  Target Date 05/14/21      PT LONG TERM GOAL #2   Title Pt will be able to demonstrate proper sitting posture for the management of cervical pain    Status New    Target Date 05/14/21       PT LONG TERM GOAL #3   Title Improved cervical L sidebending and R rotation to 30d and 60d respectively for improve cervical function    Baseline 25d and 50d    Status New    Target Date 05/14/21      PT LONG TERM GOAL #4   Title Pt will report a reduction of L cervical and L lateral shoulder pain to 0-3/10 with daily activities    Baseline 2-6/10    Status New    Target Date 05/14/21      PT LONG TERM GOAL #5   Title Improve FOTO score for neck to predicted value to 77% and shoulder to 73%    Baseline Neck 58%, shoulder 73%    Status New    Target Date 05/14/21                    Plan - 04/08/21 1413     Clinical Impression Statement Pt presents to PT with a chronis Hx of L cervical and lateral shoulder pain. Symptoms are reproduced with cervical movements and specifically those movements which close or compress the L cervical vertebral foraminal spaces. Spurlings test was Positive L. Pt is getting radicular pain into the L lateral shoulder and sometimes more distal to the L arm. Pt hold his head tilted to the L. Cervical retraction and extension movement patterns were attempted, but increased pt's symptoms. Provided education for proper posture and neutral cervical spine for comfort. Pt will benefit from PT to address deficits to decrease cervical pain to improve cervical function and QOL.    Personal Factors and Comorbidities Time since onset of injury/illness/exacerbation    Stability/Clinical Decision Making Stable/Uncomplicated    Clinical Decision Making Low    Rehab Potential Good    PT Frequency 2x / week    PT Duration 4 weeks    PT Treatment/Interventions ADLs/Self Care Home Management;Cryotherapy;Electrical Stimulation;Iontophoresis 40m/ml Dexamethasone;Moist Heat;Traction;Therapeutic exercise;Therapeutic activities;Patient/family education;Manual techniques;Spinal Manipulations;Taping;Passive range of motion;Dry needling    PT Next Visit Plan Review FOTO.  Review neutral posture. Use of ther ex, modalities, and manual techniques as indicated    Consulted and Agree with Plan of Care Patient             Patient will benefit from skilled therapeutic intervention in order to improve the following deficits and impairments:  Decreased range of motion, Pain, Postural dysfunction, Increased muscle spasms  Visit Diagnosis: Cervicalgia  Decreased ROM of neck  Other muscle spasm  Abnormal posture     Problem List There are no problems to display for this patient.  AGar PontoMS, PT 04/08/21 2:46 PM PT  CAngola on the LakeCNorth [facility]GSouth [place] N[place] [address]Phone: [phone]  Fax:  [phone] Name: Wayne PeddieMRN: [id number] of Birth: 1January 11, 1977 PHYSICAL THERAPY DISCHARGE SUMMARY  Visits from Start of Care: 1  Current functional level related to goals / functional outcomes: unknown   Remaining deficits: Unknown   Education / Equipment: NA  Patient agrees to discharge. Patient goals were not met. Patient is being discharged due to not returning since the last visit.  Lyvia Mondesir MS, PT 06/09/21 8:25 AM

## 2021-04-16 ENCOUNTER — Ambulatory Visit: Payer: BC Managed Care – PPO

## 2021-04-18 ENCOUNTER — Ambulatory Visit: Payer: BC Managed Care – PPO

## 2021-04-25 ENCOUNTER — Ambulatory Visit: Payer: BC Managed Care – PPO

## 2021-05-02 ENCOUNTER — Ambulatory Visit: Payer: BC Managed Care – PPO | Attending: Family Medicine

## 2021-05-24 DIAGNOSIS — Z0000 Encounter for general adult medical examination without abnormal findings: Secondary | ICD-10-CM | POA: Diagnosis not present

## 2021-05-24 DIAGNOSIS — Z125 Encounter for screening for malignant neoplasm of prostate: Secondary | ICD-10-CM | POA: Diagnosis not present

## 2021-05-26 DIAGNOSIS — B351 Tinea unguium: Secondary | ICD-10-CM | POA: Diagnosis not present

## 2021-05-26 DIAGNOSIS — R739 Hyperglycemia, unspecified: Secondary | ICD-10-CM | POA: Diagnosis not present

## 2021-05-26 DIAGNOSIS — Z23 Encounter for immunization: Secondary | ICD-10-CM | POA: Diagnosis not present

## 2021-05-26 DIAGNOSIS — E781 Pure hyperglyceridemia: Secondary | ICD-10-CM | POA: Diagnosis not present

## 2021-05-26 DIAGNOSIS — I10 Essential (primary) hypertension: Secondary | ICD-10-CM | POA: Diagnosis not present

## 2021-05-26 DIAGNOSIS — Z0000 Encounter for general adult medical examination without abnormal findings: Secondary | ICD-10-CM | POA: Diagnosis not present

## 2022-03-03 DIAGNOSIS — E782 Mixed hyperlipidemia: Secondary | ICD-10-CM | POA: Diagnosis not present

## 2022-03-03 DIAGNOSIS — R739 Hyperglycemia, unspecified: Secondary | ICD-10-CM | POA: Diagnosis not present

## 2022-03-03 DIAGNOSIS — I10 Essential (primary) hypertension: Secondary | ICD-10-CM | POA: Diagnosis not present

## 2022-03-07 DIAGNOSIS — E782 Mixed hyperlipidemia: Secondary | ICD-10-CM | POA: Diagnosis not present

## 2022-03-07 DIAGNOSIS — E1165 Type 2 diabetes mellitus with hyperglycemia: Secondary | ICD-10-CM | POA: Diagnosis not present

## 2022-03-07 DIAGNOSIS — K219 Gastro-esophageal reflux disease without esophagitis: Secondary | ICD-10-CM | POA: Diagnosis not present

## 2022-03-07 DIAGNOSIS — I10 Essential (primary) hypertension: Secondary | ICD-10-CM | POA: Diagnosis not present

## 2022-08-15 DIAGNOSIS — Z125 Encounter for screening for malignant neoplasm of prostate: Secondary | ICD-10-CM | POA: Diagnosis not present

## 2022-08-15 DIAGNOSIS — Z0000 Encounter for general adult medical examination without abnormal findings: Secondary | ICD-10-CM | POA: Diagnosis not present

## 2022-08-15 DIAGNOSIS — Z114 Encounter for screening for human immunodeficiency virus [HIV]: Secondary | ICD-10-CM | POA: Diagnosis not present

## 2022-08-21 DIAGNOSIS — I10 Essential (primary) hypertension: Secondary | ICD-10-CM | POA: Diagnosis not present

## 2022-08-21 DIAGNOSIS — Z1211 Encounter for screening for malignant neoplasm of colon: Secondary | ICD-10-CM | POA: Diagnosis not present

## 2022-08-21 DIAGNOSIS — J309 Allergic rhinitis, unspecified: Secondary | ICD-10-CM | POA: Diagnosis not present

## 2022-08-21 DIAGNOSIS — F418 Other specified anxiety disorders: Secondary | ICD-10-CM | POA: Diagnosis not present

## 2022-08-21 DIAGNOSIS — Z0000 Encounter for general adult medical examination without abnormal findings: Secondary | ICD-10-CM | POA: Diagnosis not present

## 2022-09-04 DIAGNOSIS — J069 Acute upper respiratory infection, unspecified: Secondary | ICD-10-CM | POA: Diagnosis not present

## 2022-09-04 DIAGNOSIS — J329 Chronic sinusitis, unspecified: Secondary | ICD-10-CM | POA: Diagnosis not present

## 2022-09-04 DIAGNOSIS — B9689 Other specified bacterial agents as the cause of diseases classified elsewhere: Secondary | ICD-10-CM | POA: Diagnosis not present

## 2022-09-20 DIAGNOSIS — Z1211 Encounter for screening for malignant neoplasm of colon: Secondary | ICD-10-CM | POA: Diagnosis not present

## 2022-09-20 DIAGNOSIS — K219 Gastro-esophageal reflux disease without esophagitis: Secondary | ICD-10-CM | POA: Diagnosis not present

## 2022-09-20 DIAGNOSIS — J069 Acute upper respiratory infection, unspecified: Secondary | ICD-10-CM | POA: Diagnosis not present

## 2023-02-08 DIAGNOSIS — R7303 Prediabetes: Secondary | ICD-10-CM | POA: Diagnosis not present

## 2023-02-08 DIAGNOSIS — J329 Chronic sinusitis, unspecified: Secondary | ICD-10-CM | POA: Diagnosis not present

## 2023-02-08 DIAGNOSIS — E782 Mixed hyperlipidemia: Secondary | ICD-10-CM | POA: Diagnosis not present

## 2023-02-08 DIAGNOSIS — M545 Low back pain, unspecified: Secondary | ICD-10-CM | POA: Diagnosis not present

## 2023-02-08 DIAGNOSIS — J069 Acute upper respiratory infection, unspecified: Secondary | ICD-10-CM | POA: Diagnosis not present

## 2023-02-08 DIAGNOSIS — B9689 Other specified bacterial agents as the cause of diseases classified elsewhere: Secondary | ICD-10-CM | POA: Diagnosis not present

## 2023-02-13 DIAGNOSIS — E782 Mixed hyperlipidemia: Secondary | ICD-10-CM | POA: Diagnosis not present

## 2023-02-13 DIAGNOSIS — I10 Essential (primary) hypertension: Secondary | ICD-10-CM | POA: Diagnosis not present

## 2023-02-13 DIAGNOSIS — J069 Acute upper respiratory infection, unspecified: Secondary | ICD-10-CM | POA: Diagnosis not present

## 2023-02-13 DIAGNOSIS — J309 Allergic rhinitis, unspecified: Secondary | ICD-10-CM | POA: Diagnosis not present

## 2023-02-28 DIAGNOSIS — J069 Acute upper respiratory infection, unspecified: Secondary | ICD-10-CM | POA: Diagnosis not present

## 2023-02-28 DIAGNOSIS — B9689 Other specified bacterial agents as the cause of diseases classified elsewhere: Secondary | ICD-10-CM | POA: Diagnosis not present

## 2023-02-28 DIAGNOSIS — J329 Chronic sinusitis, unspecified: Secondary | ICD-10-CM | POA: Diagnosis not present

## 2023-03-05 DIAGNOSIS — J069 Acute upper respiratory infection, unspecified: Secondary | ICD-10-CM | POA: Diagnosis not present

## 2023-03-05 DIAGNOSIS — J329 Chronic sinusitis, unspecified: Secondary | ICD-10-CM | POA: Diagnosis not present

## 2023-03-05 DIAGNOSIS — B9689 Other specified bacterial agents as the cause of diseases classified elsewhere: Secondary | ICD-10-CM | POA: Diagnosis not present

## 2023-07-17 DIAGNOSIS — U071 COVID-19: Secondary | ICD-10-CM | POA: Diagnosis not present

## 2024-01-03 DIAGNOSIS — Z125 Encounter for screening for malignant neoplasm of prostate: Secondary | ICD-10-CM | POA: Diagnosis not present

## 2024-01-03 DIAGNOSIS — Z0000 Encounter for general adult medical examination without abnormal findings: Secondary | ICD-10-CM | POA: Diagnosis not present

## 2024-01-14 DIAGNOSIS — Z0000 Encounter for general adult medical examination without abnormal findings: Secondary | ICD-10-CM | POA: Diagnosis not present

## 2024-02-11 DIAGNOSIS — K219 Gastro-esophageal reflux disease without esophagitis: Secondary | ICD-10-CM | POA: Diagnosis not present

## 2024-02-11 DIAGNOSIS — Z789 Other specified health status: Secondary | ICD-10-CM | POA: Diagnosis not present

## 2024-02-11 DIAGNOSIS — E782 Mixed hyperlipidemia: Secondary | ICD-10-CM | POA: Diagnosis not present

## 2024-02-11 DIAGNOSIS — I10 Essential (primary) hypertension: Secondary | ICD-10-CM | POA: Diagnosis not present

## 2024-03-31 DIAGNOSIS — Z1211 Encounter for screening for malignant neoplasm of colon: Secondary | ICD-10-CM | POA: Diagnosis not present

## 2024-05-16 DIAGNOSIS — Z79899 Other long term (current) drug therapy: Secondary | ICD-10-CM | POA: Diagnosis not present

## 2024-05-16 DIAGNOSIS — I10 Essential (primary) hypertension: Secondary | ICD-10-CM | POA: Diagnosis not present

## 2024-05-21 DIAGNOSIS — L608 Other nail disorders: Secondary | ICD-10-CM | POA: Diagnosis not present

## 2024-05-21 DIAGNOSIS — B351 Tinea unguium: Secondary | ICD-10-CM | POA: Diagnosis not present

## 2024-05-21 DIAGNOSIS — I10 Essential (primary) hypertension: Secondary | ICD-10-CM | POA: Diagnosis not present

## 2024-05-22 DIAGNOSIS — K635 Polyp of colon: Secondary | ICD-10-CM | POA: Diagnosis not present

## 2024-05-22 DIAGNOSIS — Z1211 Encounter for screening for malignant neoplasm of colon: Secondary | ICD-10-CM | POA: Diagnosis not present

## 2024-05-22 DIAGNOSIS — K5730 Diverticulosis of large intestine without perforation or abscess without bleeding: Secondary | ICD-10-CM | POA: Diagnosis not present

## 2024-06-04 DIAGNOSIS — R0981 Nasal congestion: Secondary | ICD-10-CM | POA: Diagnosis not present

## 2024-06-04 DIAGNOSIS — I10 Essential (primary) hypertension: Secondary | ICD-10-CM | POA: Diagnosis not present

## 2024-07-02 DIAGNOSIS — R0981 Nasal congestion: Secondary | ICD-10-CM | POA: Diagnosis not present

## 2024-07-02 DIAGNOSIS — I10 Essential (primary) hypertension: Secondary | ICD-10-CM | POA: Diagnosis not present

## 2024-07-02 DIAGNOSIS — Z7185 Encounter for immunization safety counseling: Secondary | ICD-10-CM | POA: Diagnosis not present

## 2024-08-19 DIAGNOSIS — D1801 Hemangioma of skin and subcutaneous tissue: Secondary | ICD-10-CM | POA: Diagnosis not present

## 2024-08-19 DIAGNOSIS — L578 Other skin changes due to chronic exposure to nonionizing radiation: Secondary | ICD-10-CM | POA: Diagnosis not present

## 2024-08-19 DIAGNOSIS — B351 Tinea unguium: Secondary | ICD-10-CM | POA: Diagnosis not present

## 2024-08-19 DIAGNOSIS — L821 Other seborrheic keratosis: Secondary | ICD-10-CM | POA: Diagnosis not present
